# Patient Record
Sex: Male | Born: 1955
Health system: Southern US, Community
[De-identification: ages and names within clinical notes are randomized; demographics above are authoritative.]

## PROBLEM LIST (undated history)

## (undated) DIAGNOSIS — K5730 Diverticulosis of large intestine without perforation or abscess without bleeding: Secondary | ICD-10-CM

## (undated) DIAGNOSIS — L719 Rosacea, unspecified: Secondary | ICD-10-CM

## (undated) HISTORY — DX: Rosacea, unspecified: L71.9

## (undated) HISTORY — DX: Diverticulosis of large intestine without perforation or abscess without bleeding: K57.30

---

## 2009-12-24 HISTORY — PX: APPENDECTOMY: SHX54

## 2009-12-27 ENCOUNTER — Emergency Department (HOSPITAL_COMMUNITY): Admission: EM | Admit: 2009-12-27 | Discharge: 2009-12-27 | Payer: Self-pay | Admitting: Emergency Medicine

## 2009-12-28 ENCOUNTER — Encounter (INDEPENDENT_AMBULATORY_CARE_PROVIDER_SITE_OTHER): Payer: Self-pay

## 2009-12-28 ENCOUNTER — Inpatient Hospital Stay (HOSPITAL_COMMUNITY): Admission: EM | Admit: 2009-12-28 | Discharge: 2009-12-29 | Payer: Self-pay | Admitting: Emergency Medicine

## 2010-02-08 ENCOUNTER — Encounter: Payer: Self-pay | Admitting: Family Medicine

## 2010-02-08 ENCOUNTER — Ambulatory Visit: Payer: Self-pay | Admitting: Family Medicine

## 2010-02-08 DIAGNOSIS — L259 Unspecified contact dermatitis, unspecified cause: Secondary | ICD-10-CM | POA: Insufficient documentation

## 2010-02-08 DIAGNOSIS — D696 Thrombocytopenia, unspecified: Secondary | ICD-10-CM | POA: Insufficient documentation

## 2010-02-08 DIAGNOSIS — D6949 Other primary thrombocytopenia: Secondary | ICD-10-CM | POA: Insufficient documentation

## 2010-02-08 LAB — CONVERTED CEMR LAB
AST: 19 units/L (ref 0–37)
BUN: 17 mg/dL (ref 6–23)
Basophils Absolute: 0.1 10*3/uL (ref 0.0–0.1)
Calcium: 9.3 mg/dL (ref 8.4–10.5)
Chloride: 102 meq/L (ref 96–112)
Creatinine, Ser: 1.1 mg/dL (ref 0.40–1.50)
Eosinophils Relative: 2 % (ref 0–5)
HCT: 44.4 % (ref 39.0–52.0)
Hemoglobin: 14.6 g/dL (ref 13.0–17.0)
LDL Cholesterol: 114 mg/dL — ABNORMAL HIGH (ref 0–99)
Lymphocytes Relative: 34 % (ref 12–46)
Lymphs Abs: 2.4 10*3/uL (ref 0.7–4.0)
Monocytes Absolute: 0.8 10*3/uL (ref 0.1–1.0)
Monocytes Relative: 11 % (ref 3–12)
RDW: 13.7 % (ref 11.5–15.5)
Total CHOL/HDL Ratio: 5.1
VLDL: 43 mg/dL — ABNORMAL HIGH (ref 0–40)

## 2010-02-10 ENCOUNTER — Encounter: Payer: Self-pay | Admitting: Family Medicine

## 2010-05-04 ENCOUNTER — Encounter: Payer: Self-pay | Admitting: Family Medicine

## 2010-05-25 NOTE — Assessment & Plan Note (Signed)
Summary: Jack Wood,df   Vital Signs:  Patient profile:   55 year old male Height:      68.5 inches Weight:      181.38 pounds BMI:     27.28 BSA:     1.97 Pulse rate:   80 / minute BP sitting:   112 / 78  Vitals Entered By: Jone Baseman CMA (February 08, 2010 8:52 AM) CC: Jack Wood Is Patient Diabetic? No Pain Assessment Patient in pain? no        Primary Care Provider:  Alvia Grove DO  CC:  Jack Wood.  History of Present Illness: 55 yo male here for Jack Wood appt to establish medical care.  Concerns today include: 1. Follow up of appendectomy done about 3-4 weeks ago.  Denies abd pain, no fevers, incisions healed well, no draining.  +BM's, + normal UOP.  Did not f/u with surgeon and feels he does not need to due to no percieved complications.  Was not discharged with any abx, is not using any pain meds.  returned to previous healthy basleine. 2. Hx of ITP: would like labs today to evaluate. Does have a hx of blood transfusion in 1991. 3. Preventative medicine: would like to catch up on screening.   4. Skin rash: on face, is red, dry and flaky.  No pattern noted. Present for about 4-6 weeks.  No recent sun exposure. No known trigger. No hx of similar in past.   Habits & Providers  Alcohol-Tobacco-Diet     Alcohol drinks/day: <1     Alcohol Counseling: not indicated; patient does not drink     Tobacco Status: never  Exercise-Depression-Behavior     Does Patient Exercise: no     Have you felt down or hopeless? no     STD Risk: never     Drug Use: no     Seat Belt Use: always     Sun Exposure: rarely  Current Problems (verified): 1)  Laboratory Examination Unspecified  (ICD-V72.60) 2)  Other Primary Thrombocytopenia  (ICD-287.39) 3)  Family History of Cad Male 1st Degree Relative <50  (ICD-V17.3) 4)  Family History of Cad Male 1st Degree Relative <60  (ICD-V16.49) 5)  Skin Rash, Allergic  (ICD-692.9) 6)  Screening For Lipoid Disorders  (ICD-V77.91) 7)  Unspecified Thrombocytopenia   (ICD-287.5)  Current Medications (verified): 1)  None  Allergies (verified): No Known Drug Allergies  Past History:  Past Medical History: ITP, s/p blood transfusion in 1991  Past Surgical History: Appendectomy (2011)  Family History: Family History of CAD Male 1st degree relative <60 Family History of CAD Male 1st degree relative <50 Family History of Stroke F 1st degree relative <60 Family History of Stroke M 1st degree relative <50 Father died at 42 yo Mother died at 49 yo  Social History: Recently moved to [place] from [place].   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 Never Smoked Alcohol use-yes, occasional Drug use-no Regular exercise-no, healthy diet Married to [employer] (also my pt) 2 children, both adopted, Engineer, maintenance (13) and Diplomatic Services operational officer (16) Enjoys camping and fishing Smoking Status:  never Drug Use:  no Does Patient Exercise:  no STD Risk:  never Risk analyst Use:  always Sun Exposure-Excessive:  rarely  Review of Systems  The patient denies anorexia, fever, weight loss, weight gain, vision loss, decreased hearing, hoarseness, chest pain, syncope, dyspnea on exertion, peripheral edema, prolonged cough, headaches, hemoptysis, abdominal pain, melena, hematochezia, severe indigestion/heartburn, hematuria, incontinence, genital sores,  muscle weakness, suspicious skin lesions, transient blindness, difficulty walking, depression, unusual weight change, abnormal bleeding, enlarged lymph nodes, angioedema, breast masses, and testicular masses.    Physical Exam  General:  Vs reviewed, alert, well-developed, well-nourished, and well-hydrated.   Head:  Normocephalic and atraumatic without obvious abnormalities.  Eyes:  No corneal or conjunctival inflammation noted. EOMI. Perrla. Funduscopic exam benign, without hemorrhages, exudates or papilledema. Vision grossly normal. Ears:  R ear normal, L ear normal, and no external deformities.    Nose:  no external deformity and no nasal discharge.   Mouth:  no gingival abnormalities and pharynx pink and moist.   Neck:  supple, full ROM, and no masses.   Lungs:  Normal respiratory effort, chest expands symmetrically. Lungs are clear to auscultation, no crackles or wheezes. Heart:  Normal rate and regular rhythm. S1 and S2 normal without gallop, murmur, click, rub or other extra sounds. Abdomen:  Bowel sounds positive,abdomen soft and non-tender without masses, organomegaly or hernias noted. Msk:  normal ROM, no joint tenderness, and no joint swelling.   Extremities:  No clubbing, cyanosis, edema, or deformity noted with normal full range of motion of all joints.   Neurologic:  alert & oriented X3 and cranial nerves II-XII intact.   Skin:  face is dry and red, no pattern noted.   Cervical Nodes:  No lymphadenopathy noted Psych:  Oriented X3, good eye contact, not anxious appearing, and not depressed appearing.     Impression & Recommendations:  Problem # 1:  Preventive Health Care (ICD-V70.0) Assessment New Pt requests catch up on preventative screening today. Will refer to GI for colonoscopy FLP today Dental referral   Problem # 2:  SKIN RASH, ALLERGIC (ICD-692.9) Worsening in the past few weeks per pt report.  Will refer to derm Orders: Dermatology Referral (Derma)  Problem # 3:  UNSPECIFIED THROMBOCYTOPENIA (ICD-287.5) Hx of.   Will check CBC today to evaluate platlets.   Problem # 4:  LABORATORY EXAMINATION UNSPECIFIED (ICD-V72.60) check cmp today   Other Orders: Lipid-[facility] (0011001100) Lipid-[facility] (40981-19147) CBC w/Diff-[facility] (82956) Comp Met-[facility] (21308-65784) Gastroenterology Referral (GI)  Patient Instructions: 1)  Nice to meet you! 2)  I will call you when I get your labs back 3)  Call Dr. Leanord Asal [phone]) for a dental appointment 4)  [address] [place] 5)  I will set up a GI and dermatology referral for you.  6)  Give your wife my  best! 7)  Please schedule a follow-up appointment as needed .    Orders Added: 1)  Lipid-[facility] [[id number]] 2)  Lipid-[facility] [80061-22930] 3)  CBC w/Diff-[facility] [85025] 4)  Comp Met-[facility] [[id number]] 5)  [facility]- New 40-23yrs [32440] 6)  Gastroenterology Referral [GI] 7)  Dermatology Referral [Derma]     Prevention & Chronic Care Immunizations   Influenza vaccine: Not documented   Influenza vaccine deferral: Deferred  (02/08/2010)    Tetanus booster: Not documented    Pneumococcal vaccine: Not documented  Colorectal Screening   Hemoccult: Not documented    Colonoscopy: Not documented   Colonoscopy action/deferral: GI referral  (02/08/2010)  Other Screening   PSA: Not documented   PSA action/deferral: Discussed-decision deferred  (02/08/2010)   Smoking status: never  (02/08/2010)  Lipids   Total Cholesterol: Not documented   Lipid panel action/deferral: Lipid Panel ordered   LDL: Not documented   LDL Direct: Not documented   HDL: Not documented   Triglycerides: Not documented

## 2010-05-25 NOTE — Letter (Signed)
Summary: Lipid Letter  [facility]  [address]   [place], [place] [address]   Phone: [phone]  Fax: [phone]    02/10/2010  Jack Wood [address] [place], [place]  [address]  Dear Jack Wood:  We have carefully reviewed your last lipid profile from 02/08/2010 and the results are noted below with a summary of recommendations for lipid management.    Cholesterol:       195     Goal: < 200   HDL "good" Cholesterol:   38     Goal: > 40   LDL "bad" Cholesterol:   114     Goal: < 120   Triglycerides:       214     Goal: < 200        TLC Diet (Therapeutic Lifestyle Change): Saturated Fats & Transfatty acids should be kept < 7% of total calories ***Reduce Saturated Fats Polyunstaurated Fat can be up to 10% of total calories Monounsaturated Fat Fat can be up to 20% of total calories Total Fat should be no greater than 25-35% of total calories Carbohydrates should be 50-60% of total calories Protein should be approximately 15% of total calories Fiber should be at least 20-30 grams a day ***Increased fiber may help lower LDL Total Cholesterol should be < 200mg /day Consider adding plant stanol/sterols to diet (example: Benacol spread) ***A higher intake of unsaturated fat may reduce Triglycerides and Increase HDL    Adjunctive Measures (may lower LIPIDS and reduce risk of Heart Attack) include: Aerobic Exercise (20-30 minutes 3-4 times a week) Limit Alcohol Consumption Weight Reduction Aspirin 75-81 mg a day by mouth (if not allergic or contraindicated) Dietary Fiber 20-30 grams a day by mouth     Current Medications:  None If you have any questions, please call. We appreciate being able to work with you.   Sincerely,    [facility] Alvia Grove DO

## 2010-05-27 NOTE — Letter (Signed)
Summary: Generic Letter  FU Referrals  [facility]  [address]   [place], [place] [address]   Phone: [phone]  Fax: [phone]    05/04/2010  Jack Wood [address] [place], [place]  [address]  Dear [name],  Hope Epic is not taking too much time and you still have some for taking care of yourself!  Unfortunately Dr Gomez Cleverly is no longer with [place], we have assigned you to see Dr Ellery Plunk if you need medical care.  In following up on Dr Cyndra Numbers referrals we noticed that you had been referred to Dermatology and Gastroenterology but we have not had any reports from them.   If you have not seen them and would like our assistance in scheduling an appointment please call.  Take Care  Sincerely,  Doneta Public MD  Appended Document: Generic Letter  FU Referrals mailed  Appended Document: Generic Letter  FU Referrals mailed

## 2010-06-07 ENCOUNTER — Other Ambulatory Visit: Payer: Self-pay | Admitting: Emergency Medicine

## 2010-06-09 ENCOUNTER — Ambulatory Visit
Admission: RE | Admit: 2010-06-09 | Discharge: 2010-06-09 | Disposition: A | Discharge status: Home / Self Care | Payer: Commercial Managed Care - PPO | Source: Ambulatory Visit | Attending: Emergency Medicine | Admitting: Emergency Medicine

## 2010-06-09 MED ORDER — IOHEXOL 300 MG/ML  SOLN
100.0000 mL | Freq: Once | INTRAMUSCULAR | Status: AC | PRN
  Administered 2010-06-09: 100 mL via INTRAVENOUS

## 2010-07-08 LAB — URINALYSIS, ROUTINE W REFLEX MICROSCOPIC
Nitrite: NEGATIVE
Protein, ur: NEGATIVE mg/dL
Urobilinogen, UA: 0.2 mg/dL (ref 0.0–1.0)

## 2010-07-08 LAB — DIFFERENTIAL
Basophils Absolute: 0 10*3/uL (ref 0.0–0.1)
Eosinophils Absolute: 0.3 10*3/uL (ref 0.0–0.7)
Lymphocytes Relative: 31 % (ref 12–46)
Lymphs Abs: 2.7 10*3/uL (ref 0.7–4.0)
Neutrophils Relative %: 54 % (ref 43–77)

## 2010-07-08 LAB — CBC
Platelets: 194 10*3/uL (ref 150–400)
RBC: 4.62 MIL/uL (ref 4.22–5.81)
RDW: 12.9 % (ref 11.5–15.5)
WBC: 8.6 10*3/uL (ref 4.0–10.5)

## 2010-07-08 LAB — BASIC METABOLIC PANEL
BUN: 15 mg/dL (ref 6–23)
Calcium: 8.7 mg/dL (ref 8.4–10.5)
Creatinine, Ser: 0.99 mg/dL (ref 0.4–1.5)
GFR calc Af Amer: >60 mL/min (ref >60)

## 2010-08-27 ENCOUNTER — Ambulatory Visit (HOSPITAL_COMMUNITY)
Admission: RE | Admit: 2010-08-27 | Discharge: 2010-08-27 | Disposition: A | Discharge status: Home / Self Care | Payer: 59 | Source: Ambulatory Visit | Attending: Gastroenterology | Admitting: Gastroenterology

## 2010-08-27 DIAGNOSIS — K648 Other hemorrhoids: Secondary | ICD-10-CM | POA: Insufficient documentation

## 2010-08-27 DIAGNOSIS — Z79899 Other long term (current) drug therapy: Secondary | ICD-10-CM | POA: Insufficient documentation

## 2010-08-27 DIAGNOSIS — Z7982 Long term (current) use of aspirin: Secondary | ICD-10-CM | POA: Insufficient documentation

## 2010-08-27 DIAGNOSIS — Z1211 Encounter for screening for malignant neoplasm of colon: Secondary | ICD-10-CM | POA: Insufficient documentation

## 2010-08-27 DIAGNOSIS — K644 Residual hemorrhoidal skin tags: Secondary | ICD-10-CM | POA: Insufficient documentation

## 2010-08-27 DIAGNOSIS — L719 Rosacea, unspecified: Secondary | ICD-10-CM | POA: Insufficient documentation

## 2010-08-27 LAB — HM COLONOSCOPY

## 2012-01-25 ENCOUNTER — Other Ambulatory Visit: Payer: Self-pay | Admitting: Internal Medicine

## 2012-01-25 MED ORDER — SCOPOLAMINE 1 MG/3DAYS TD PT72: 1.0000 | MEDICATED_PATCH | TRANSDERMAL | Status: DC

## 2012-03-26 ENCOUNTER — Encounter: Payer: Self-pay | Admitting: Internal Medicine

## 2013-08-15 ENCOUNTER — Ambulatory Visit (INDEPENDENT_AMBULATORY_CARE_PROVIDER_SITE_OTHER): Payer: 59 | Admitting: Marriage and Family Therapist

## 2013-08-15 ENCOUNTER — Encounter (INDEPENDENT_AMBULATORY_CARE_PROVIDER_SITE_OTHER): Payer: Self-pay

## 2013-08-15 DIAGNOSIS — Z62820 Parent-biological child conflict: Secondary | ICD-10-CM

## 2013-08-15 DIAGNOSIS — Z7189 Other specified counseling: Secondary | ICD-10-CM

## 2013-08-15 DIAGNOSIS — F411 Generalized anxiety disorder: Secondary | ICD-10-CM

## 2013-08-15 DIAGNOSIS — Z630 Problems in relationship with spouse or partner: Secondary | ICD-10-CM

## 2013-08-15 NOTE — Progress Notes (Signed)
MARRIAGE COUNSELING ASSESSMENT  Session Time:  8:00 - 9:00 a.m.  Participation Level: Active  Behavioral Response: CasualAlertAnxious  Type of Therapy: Marriage Counseling  Treatment Goals addressed: Anxiety, Communication: Listening skills, Coping and Diagnosis: Determining diagnosis  Interventions: Other: assessing and identification of problems  Summary: Jack Wood is a 58 y.o. male who presents with anxiety.  He was present with his wife, Jack Wood for marriage counseling.  The couples are Caucasian.  They were referred for marriage counseling by Jack Wood who sees their daughter, Jack Wood.  Patient and wife say they are here to learn coping skills relating to working together as a team relating to problems with their children.  Both reports a lot of stress and anxiety individually and as a couple due to the "chaos." They reports having two adopted children who both have severe mental health problems.  Right now they report the focus is on their 56 year-old daughter.  They reports since she was 8118 months old they knew there was a problem mentally with their child.  They report having little information about the parents but did know she is from [place], her mother was young, and patient reported the possibility that her mother was drinking.  He reports she has ADHD, depression, oppositional defiant disorder, and intermittent explosive disorder.  About three weeks ago her behavior escalated when the daughter pushed someone in school and was suspended.  They are trying to get her in homebound school and arrange in-home intensive counseling.  Both report there are problems between them as a result.  They reports the daughter tends to "manipulate and triangulate" the parents.  Wife reports she has to be the disciplinarian with the daughter and husband tends to "give in" to the daughter, causing problems between them.  Husband reports they also have poor communication causing tension between them.   Wife goes to [employer] and both have support through the parents at [facility], a substance abuse program their son attended.  They reports the sonAaron, age 56, continues to use substances and does not live in their home.  How they do work together as a couple is patient is good at paying the bills and wife is good at researching resources available to help their children. They have known each other 31 years and have been married 29 years.  Both state that no matter what happens with the children it will not cause them to be divorced, they "just need some help."  Wife is a Runner, broadcasting/film/videoteacher in [place] and husband works for [employer].  Both are busy but try to have dinner together but this does not often work.  Patient's goal for therapy, "to learn how to pick the appropriate battles with each other."  Wife's goal of therapy, "to work together as a team."  Suicidal/Homicidal: NA  Therapist Response:  Assessed individual for anxiety and depression.  Through self-discussion and body language patient does not appear depressed but more stressed and anxious.  Will talk individually with wife to rule out depression.  Both are motivated for therapy.  Treatment goal is to work more as a team and prioritize their marriage.  Talked about ways they can begin to do this including having more time together, including time when talking about the children is forbidden.  Gave the couple tips on reconnecting e.g, if they start to have an argument have eye contact and use a code word that they are getting out of hand or give each other a touch on the arm.  Treatment plan will include how to become closer as a couple, how to work on as a team, to identify systemically what is causing the distancing from each other, couple communication skills.  Discussed what to expect from counseling, gave both document on "What to Expect from Couples Counseling,"  Will see wife and husband individually in the next sessions. Discussed telephone procedure, days  therapist is here.  Discussed rules around not triagulating with therapist.  Note:  Discussed this writer being on vacation from 08/23/13 and returning on 09/10/13.    Plan: Return again in 1 month.  Diagnosis: Axis I: Generalized Anxiety Disorder; Distress with spouse; parent/child relational problem.      Axis II: Deferred    Jack Paro, LMFT, CTS 08/15/2013

## 2013-09-17 ENCOUNTER — Ambulatory Visit (HOSPITAL_COMMUNITY): Payer: Commercial Managed Care - PPO | Admitting: Marriage and Family Therapist

## 2013-09-17 NOTE — Progress Notes (Unsigned)
   THERAPIST PROGRESS NOTE  Session Time:  3:00 - 4:00 p.m.  Participation Level: {BHH PARTICIPATION LEVEL:22264}  Behavioral Response: {Appearance:22683}{BHH LEVEL OF CONSCIOUSNESS:22305}{BHH MOOD:22306}  Type of Therapy: Individual Therapy  Treatment Goals addressed: Coping  Interventions: {CHL AMB BH Type of Intervention:21022753}  Summary: Jack Wood is a 58 y.o. male who presents with anxiety and marriage problems.  He was referred by Dr. Lucianne Muss.     Suicidal/Homicidal: {BHH YES OR NO:22294}{yes/no/with/without intent/plan:22693}  Therapist Response: ***  Plan: Return again in *** weeks.  Diagnosis: Axis I: GAD; Partner relational problem    Axis II: Deferred    Annabeth Tortora, LMFT, CTS 09/17/2013

## 2013-09-18 ENCOUNTER — Telehealth (HOSPITAL_COMMUNITY): Payer: Self-pay | Admitting: Marriage and Family Therapist

## 2013-09-18 ENCOUNTER — Ambulatory Visit (HOSPITAL_COMMUNITY): Payer: Commercial Managed Care - PPO | Admitting: Marriage and Family Therapist

## 2013-09-23 ENCOUNTER — Ambulatory Visit: Payer: 59 | Admitting: Licensed Clinical Social Worker

## 2013-09-27 ENCOUNTER — Ambulatory Visit (INDEPENDENT_AMBULATORY_CARE_PROVIDER_SITE_OTHER): Payer: 59 | Admitting: Licensed Clinical Social Worker

## 2013-09-27 DIAGNOSIS — F4320 Adjustment disorder, unspecified: Secondary | ICD-10-CM

## 2013-10-04 ENCOUNTER — Ambulatory Visit (INDEPENDENT_AMBULATORY_CARE_PROVIDER_SITE_OTHER): Payer: 59 | Admitting: Licensed Clinical Social Worker

## 2013-10-04 DIAGNOSIS — F4320 Adjustment disorder, unspecified: Secondary | ICD-10-CM

## 2013-10-08 ENCOUNTER — Ambulatory Visit (HOSPITAL_COMMUNITY): Payer: Commercial Managed Care - PPO | Admitting: Marriage and Family Therapist

## 2015-01-02 ENCOUNTER — Telehealth: Payer: Self-pay

## 2015-01-02 NOTE — Telephone Encounter (Signed)
Spoke to VL about pt and his son.   PCP requested to get pt and his son scheduled.  Spoke to pt and cpe is sched for December.  Pt son is scheduled for hospital follow up for Tuesday  01/06/2015 at 9:15 am. This may need to be moved to Thursday 01/08/2015 at 9:15am depending on when he is discharged.

## 2015-03-09 ENCOUNTER — Other Ambulatory Visit: Payer: Self-pay | Admitting: Internal Medicine

## 2015-03-09 MED ORDER — AMOXICILLIN-POT CLAVULANATE 875-125 MG PO TABS: 1.0000 | ORAL_TABLET | Freq: Two times a day (BID) | ORAL | Status: AC

## 2015-03-13 ENCOUNTER — Other Ambulatory Visit: Payer: Self-pay | Admitting: Internal Medicine

## 2015-03-13 MED ORDER — AZITHROMYCIN 250 MG PO TABS: ORAL_TABLET | ORAL | Status: AC

## 2015-04-09 ENCOUNTER — Encounter: Payer: Self-pay | Admitting: Internal Medicine

## 2015-04-09 ENCOUNTER — Ambulatory Visit (INDEPENDENT_AMBULATORY_CARE_PROVIDER_SITE_OTHER): Payer: 59 | Admitting: Internal Medicine

## 2015-04-09 ENCOUNTER — Other Ambulatory Visit (INDEPENDENT_AMBULATORY_CARE_PROVIDER_SITE_OTHER): Payer: 59

## 2015-04-09 VITALS — BP 124/84 | HR 82 | Temp 97.6°F | Ht 68.5 in | Wt 192.5 lb

## 2015-04-09 DIAGNOSIS — Z136 Encounter for screening for cardiovascular disorders: Secondary | ICD-10-CM | POA: Diagnosis not present

## 2015-04-09 DIAGNOSIS — L719 Rosacea, unspecified: Secondary | ICD-10-CM | POA: Insufficient documentation

## 2015-04-09 DIAGNOSIS — R7989 Other specified abnormal findings of blood chemistry: Secondary | ICD-10-CM | POA: Diagnosis not present

## 2015-04-09 DIAGNOSIS — Z0000 Encounter for general adult medical examination without abnormal findings: Secondary | ICD-10-CM

## 2015-04-09 DIAGNOSIS — Z114 Encounter for screening for human immunodeficiency virus [HIV]: Secondary | ICD-10-CM

## 2015-04-09 DIAGNOSIS — Z1159 Encounter for screening for other viral diseases: Secondary | ICD-10-CM | POA: Diagnosis not present

## 2015-04-09 LAB — CBC WITH DIFFERENTIAL/PLATELET
BASOS ABS: 0 10*3/uL (ref 0.0–0.1)
Basophils Relative: 0.5 % (ref 0.0–3.0)
EOS ABS: 0.3 10*3/uL (ref 0.0–0.7)
Eosinophils Relative: 3.2 % (ref 0.0–5.0)
HEMATOCRIT: 46.1 % (ref 39.0–52.0)
Hemoglobin: 15.4 g/dL (ref 13.0–17.0)
LYMPHS PCT: 31.2 % (ref 12.0–46.0)
Lymphs Abs: 3.2 10*3/uL (ref 0.7–4.0)
MCHC: 33.4 g/dL (ref 30.0–36.0)
MCV: 87.8 fl (ref 78.0–100.0)
MONOS PCT: 9.1 % (ref 3.0–12.0)
Monocytes Absolute: 0.9 10*3/uL (ref 0.1–1.0)
NEUTROS ABS: 5.7 10*3/uL (ref 1.4–7.7)
Neutrophils Relative %: 56 % (ref 43.0–77.0)
PLATELETS: 281 10*3/uL (ref 150.0–400.0)
RBC: 5.25 Mil/uL (ref 4.22–5.81)
RDW: 13.6 % (ref 11.5–15.5)
WBC: 10.1 10*3/uL (ref 4.0–10.5)

## 2015-04-09 LAB — LIPID PANEL
Cholesterol: 219 mg/dL — ABNORMAL HIGH (ref 0–200)
HDL: 38.7 mg/dL — AB (ref >39.00)
NONHDL: 180.55
Total CHOL/HDL Ratio: 6
Triglycerides: 242 mg/dL — ABNORMAL HIGH (ref 0.0–149.0)
VLDL: 48.4 mg/dL — AB (ref 0.0–40.0)

## 2015-04-09 LAB — URINALYSIS, ROUTINE W REFLEX MICROSCOPIC
Bilirubin Urine: NEGATIVE
KETONES UR: NEGATIVE
LEUKOCYTES UA: NEGATIVE
Nitrite: NEGATIVE
RBC / HPF: NONE SEEN (ref 0–?)
SPECIFIC GRAVITY, URINE: 1.02 (ref 1.000–1.030)
TOTAL PROTEIN, URINE-UPE24: NEGATIVE
URINE GLUCOSE: NEGATIVE
Urobilinogen, UA: 0.2 (ref 0.0–1.0)
WBC, UA: NONE SEEN (ref 0–?)
pH: 5.5 (ref 5.0–8.0)

## 2015-04-09 LAB — BASIC METABOLIC PANEL
BUN: 18 mg/dL (ref 6–23)
CALCIUM: 9.6 mg/dL (ref 8.4–10.5)
CO2: 33 mEq/L — ABNORMAL HIGH (ref 19–32)
CREATININE: 1.04 mg/dL (ref 0.40–1.50)
Chloride: 104 mEq/L (ref 96–112)
GFR: 78.19 mL/min (ref >60.00)
Glucose, Bld: 105 mg/dL — ABNORMAL HIGH (ref 70–99)
Potassium: 4.5 mEq/L (ref 3.5–5.1)
SODIUM: 144 meq/L (ref 135–145)

## 2015-04-09 LAB — HEPATIC FUNCTION PANEL
ALBUMIN: 4.7 g/dL (ref 3.5–5.2)
ALK PHOS: 62 U/L (ref 39–117)
ALT: 25 U/L (ref 0–53)
AST: 15 U/L (ref 0–37)
BILIRUBIN DIRECT: 0.1 mg/dL (ref 0.0–0.3)
TOTAL PROTEIN: 8 g/dL (ref 6.0–8.3)
Total Bilirubin: 0.4 mg/dL (ref 0.2–1.2)

## 2015-04-09 LAB — TSH: TSH: 2.92 u[IU]/mL (ref 0.35–4.50)

## 2015-04-09 LAB — LDL CHOLESTEROL, DIRECT: Direct LDL: 156 mg/dL

## 2015-04-09 LAB — PSA: PSA: 0.89 ng/mL (ref 0.10–4.00)

## 2015-04-09 NOTE — Progress Notes (Signed)
Subjective:    Patient ID: Jack Wood, male    DOB: [birth year], 57 y.o.   MRN: [id number]  HPI  New to our practice - here to establish with PCP patient is here today for annual physical. Patient feels well and has no complaints. Also reviewed chronic medical conditions, interval events and current concerns  Past Medical History  Diagnosis Date  . Acne rosacea   . Diverticula, colon     diverticulitis in 08/2010, 11/2014   Family History  Problem Relation Age of Onset  . Coronary artery disease Father   . Valvular heart disease Father   . Hypertension Father   . Osteoporosis Mother   . Valvular heart disease Mother   . Cirrhosis Sister 37    EtOH related   Social History  Substance Use Topics  . Smoking status: Never Smoker   . Smokeless tobacco: Not on file  . Alcohol Use: 0.0 oz/week    0 Standard drinks or equivalent per week     Comment: social    Review of Systems  Constitutional: Negative for fever, activity change, appetite change, fatigue and unexpected weight change.  Respiratory: Negative for cough, chest tightness, shortness of breath and wheezing.   Cardiovascular: Negative for chest pain, palpitations and leg swelling.  Musculoskeletal: Positive for back pain (working w/ PT for same - improved in past 4 weeks).  Neurological: Negative for dizziness, weakness and headaches.  Psychiatric/Behavioral: Negative for dysphoric mood. The patient is not nervous/anxious.   All other systems reviewed and are negative.      Objective:    Physical Exam  Constitutional: He is oriented to person, place, and time. He appears well-developed and well-nourished. No distress.  HENT:  Head: Normocephalic and atraumatic.  Nose: Nose normal.  Mouth/Throat: Oropharynx is clear and moist.  Hearing grossly normal.  Eyes: Conjunctivae and EOM are normal. Pupils are equal, round, and reactive to light. No scleral icterus.  Neck: Normal range of motion. Neck supple. No  JVD present. No thyromegaly present.  Cardiovascular: Normal rate, regular rhythm, normal heart sounds and intact distal pulses.  Exam reveals no friction rub.   No murmur heard. No edema.  Pulmonary/Chest: Effort normal and breath sounds normal. No respiratory distress. He has no wheezes.  Abdominal: Soft. Bowel sounds are normal. He exhibits no distension and no mass. There is no tenderness. There is no guarding.  Genitourinary:  defer  Musculoskeletal: Normal range of motion. He exhibits no edema or tenderness.  Lymphadenopathy:    He has no cervical adenopathy.  Neurological: He is alert and oriented to person, place, and time. He has normal reflexes. No cranial nerve deficit.  Skin: Skin is warm and dry. No rash noted. No erythema.  Psychiatric: He has a normal mood and affect. His behavior is normal. Thought content normal.    BP 124/84 mmHg  Pulse 82  Temp(Src) 97.6 F (36.4 C) (Oral)  Ht 5' 8.5" (1.74 m)  Wt 192 lb 8 oz (87.317 kg)  BMI 28.84 kg/m2  SpO2 94% Wt Readings from Last 3 Encounters:  04/09/15 192 lb 8 oz (87.317 kg)  02/08/10 181 lb 6.1 oz (82.274 kg)    Lab Results  Component Value Date   WBC 7.1 02/08/2010   HGB 14.6 02/08/2010   HCT 44.4 02/08/2010   PLT 241 02/08/2010   GLUCOSE 106* 02/08/2010   CHOL 195 02/08/2010   TRIG 214* 02/08/2010   HDL 38* 02/08/2010   LDLCALC 114* 02/08/2010  ALT 28 02/08/2010   AST 19 02/08/2010   NA 140 02/08/2010   K 5.1 02/08/2010   CL 102 02/08/2010   CREATININE 1.10 02/08/2010   BUN 17 02/08/2010   CO2 28 02/08/2010    No results found.  ECG: NSR @ 68 bpm - no arrhythmia or ischemic changes     Assessment & Plan:   CPX/z00.00 - Patient has been counseled on age-appropriate routine health concerns for screening and prevention. These are reviewed and up-to-date. Immunizations are up-to-date or declined. Labs and ECG ordered/reviewed.  Problem List Items Addressed This Visit    None    Visit Diagnoses      Routine general medical examination at a health care facility    -  Primary    Relevant Orders    Basic metabolic panel    CBC with Differential/Platelet    Hepatic function panel    Lipid panel    TSH    Urinalysis, Routine w reflex microscopic ([facility])    PSA    Hepatitis C antibody    HIV antibody    Screening for cardiovascular condition        Relevant Orders    EKG 12-Lead (Completed)    Need for hepatitis C screening test        Relevant Orders    Hepatitis C antibody    Screening for HIV without presence of risk factors        Relevant Orders    HIV antibody        Rene PaciValerie Leala Bryand, MD

## 2015-04-09 NOTE — Progress Notes (Signed)
Pre visit review using our clinic review tool, if applicable. No additional management support is needed unless otherwise documented below in the visit note. 

## 2015-04-09 NOTE — Patient Instructions (Addendum)
It was good to see you today.  We have reviewed your prior records including labs and tests today  Health Maintenance reviewed - all recommended immunizations and age-appropriate screenings are up-to-date.  Test(s) ordered today. Your results will be released to MyChart (or called to you) after review, usually within 72hours after test completion. If any changes need to be made, you will be notified at that same time.  Medications reviewed and updated, no changes recommended at this time.  Please schedule followup in 12 months for annual exam and labs with Dr. Lawerance Wood, call sooner if problems.  Health Maintenance, Male A healthy lifestyle and preventative care can promote health and wellness.  Maintain regular health, dental, and eye exams.  Eat a healthy diet. Foods like vegetables, fruits, whole grains, low-fat dairy products, and lean protein foods contain the nutrients you need and are low in calories. Decrease your intake of foods high in solid fats, added sugars, and salt. Get information about a proper diet from your health care provider, if necessary.  Regular physical exercise is one of the most important things you can do for your health. Most adults should get at least 150 minutes of moderate-intensity exercise (any activity that increases your heart rate and causes you to sweat) each week. In addition, most adults need muscle-strengthening exercises on 2 or more days a week.   Maintain a healthy weight. The body mass index (BMI) is a screening tool to identify possible weight problems. It provides an estimate of body fat based on height and weight. Your health care provider can find your BMI and can help you achieve or maintain a healthy weight. For males 20 years and older:  A BMI below 18.5 is considered underweight.  A BMI of 18.5 to 24.9 is normal.  A BMI of 25 to 29.9 is considered overweight.  A BMI of 30 and above is considered obese.  Maintain normal blood lipids and  cholesterol by exercising and minimizing your intake of saturated fat. Eat a balanced diet with plenty of fruits and vegetables. Blood tests for lipids and cholesterol should begin at age 80 and be repeated every 5 years. If your lipid or cholesterol levels are high, you are over age 10, or you are at high risk for heart disease, you may need your cholesterol levels checked more frequently.Ongoing high lipid and cholesterol levels should be treated with medicines if diet and exercise are not working.  If you smoke, find out from your health care provider how to quit. If you do not use tobacco, do not start.  Lung cancer screening is recommended for adults aged 55-80 years who are at high risk for developing lung cancer because of a history of smoking. A yearly low-dose CT scan of the lungs is recommended for people who have at least a 30-pack-year history of smoking and are current smokers or have quit within the past 15 years. A pack year of smoking is smoking an average of 1 pack of cigarettes a day for 1 year (for example, a 30-pack-year history of smoking could mean smoking 1 pack a day for 30 years or 2 packs a day for 15 years). Yearly screening should continue until the smoker has stopped smoking for at least 15 years. Yearly screening should be stopped for people who develop a health problem that would prevent them from having lung cancer treatment.  If you choose to drink alcohol, do not have more than 2 drinks per day. One drink is considered  to be 12 oz (360 mL) of beer, 5 oz (150 mL) of wine, or 1.5 oz (45 mL) of liquor.  Avoid the use of street drugs. Do not share needles with anyone. Ask for help if you need support or instructions about stopping the use of drugs.  High blood pressure causes heart disease and increases the risk of stroke. High blood pressure is more likely to develop in:  People who have blood pressure in the end of the normal range (100-139/85-89 mm Hg).  People who are  overweight or obese.  People who are African American.  If you are 58-18 years of age, have your blood pressure checked every 3-5 years. If you are 29 years of age or older, have your blood pressure checked every year. You should have your blood pressure measured twice--once when you are at a hospital or clinic, and once when you are not at a hospital or clinic. Record the average of the two measurements. To check your blood pressure when you are not at a hospital or clinic, you can use:  An automated blood pressure machine at a pharmacy.  A home blood pressure monitor.  If you are 62-89 years old, ask your health care provider if you should take aspirin to prevent heart disease.  Diabetes screening involves taking a blood sample to check your fasting blood sugar level. This should be done once every 3 years after age 48 if you are at a normal weight and without risk factors for diabetes. Testing should be considered at a younger age or be carried out more frequently if you are overweight and have at least 1 risk factor for diabetes.  Colorectal cancer can be detected and often prevented. Most routine colorectal cancer screening begins at the age of 24 and continues through age 56. However, your health care provider may recommend screening at an earlier age if you have risk factors for colon cancer. On a yearly basis, your health care provider may provide home test kits to check for hidden blood in the stool. A small camera at the end of a tube may be used to directly examine the colon (sigmoidoscopy or colonoscopy) to detect the earliest forms of colorectal cancer. Talk to your health care provider about this at age 96 when routine screening begins. A direct exam of the colon should be repeated every 5-10 years through age 54, unless early forms of precancerous polyps or small growths are found.  People who are at an increased risk for hepatitis B should be screened for this virus. You are  considered at high risk for hepatitis B if:  You were born in a country where hepatitis B occurs often. Talk with your health care provider about which countries are considered high risk.  Your parents were born in a high-risk country and you have not received a shot to protect against hepatitis B (hepatitis B vaccine).  You have HIV or AIDS.  You use needles to inject street drugs.  You live with, or have sex with, someone who has hepatitis B.  You are a man who has sex with other men (MSM).  You get hemodialysis treatment.  You take certain medicines for conditions like cancer, organ transplantation, and autoimmune conditions.  Hepatitis C blood testing is recommended for all people born from [birth year] through 1965 and any individual with known risk factors for hepatitis C.  Healthy men should no longer receive prostate-specific antigen (PSA) blood tests as part of routine cancer screening. Talk  to your health care provider about prostate cancer screening.  Testicular cancer screening is not recommended for adolescents or adult males who have no symptoms. Screening includes self-exam, a health care provider exam, and other screening tests. Consult with your health care provider about any symptoms you have or any concerns you have about testicular cancer.  Practice safe sex. Use condoms and avoid high-risk sexual practices to reduce the spread of sexually transmitted infections (STIs).  You should be screened for STIs, including gonorrhea and chlamydia if:  You are sexually active and are younger than 24 years.  You are older than 24 years, and your health care provider tells you that you are at risk for this type of infection.  Your sexual activity has changed since you were last screened, and you are at an increased risk for chlamydia or gonorrhea. Ask your health care provider if you are at risk.  If you are at risk of being infected with HIV, it is recommended that you take a  prescription medicine daily to prevent HIV infection. This is called pre-exposure prophylaxis (PrEP). You are considered at risk if:  You are a man who has sex with other men (MSM).  You are a heterosexual man who is sexually active with multiple partners.  You take drugs by injection.  You are sexually active with a partner who has HIV.  Talk with your health care provider about whether you are at high risk of being infected with HIV. If you choose to begin PrEP, you should first be tested for HIV. You should then be tested every 3 months for as long as you are taking PrEP.  Use sunscreen. Apply sunscreen liberally and repeatedly throughout the day. You should seek shade when your shadow is shorter than you. Protect yourself by wearing long sleeves, pants, a wide-brimmed hat, and sunglasses year round whenever you are outdoors.  Tell your health care provider of new moles or changes in moles, especially if there is a change in shape or color. Also, tell your health care provider if a mole is larger than the size of a pencil eraser.  A one-time screening for abdominal aortic aneurysm (AAA) and surgical repair of large AAAs by ultrasound is recommended for men aged 65-75 years who are current or former smokers.  Stay current with your vaccines (immunizations).   This information is not intended to replace advice given to you by your health care provider. Make sure you discuss any questions you have with your health care provider.   Document Released: 10/08/2007 Document Revised: 05/02/2014 Document Reviewed: 09/06/2010 Elsevier Interactive Patient Education [employer]2016 Elsevier Inc.

## 2015-04-10 LAB — HEPATITIS C ANTIBODY: HCV Ab: NEGATIVE

## 2015-04-10 LAB — HIV ANTIBODY (ROUTINE TESTING W REFLEX): HIV 1&2 Ab, 4th Generation: NONREACTIVE

## 2015-11-07 ENCOUNTER — Telehealth: Payer: Self-pay | Admitting: Internal Medicine

## 2015-11-07 MED ORDER — SCOPOLAMINE 1 MG/3DAYS TD PT72: 1.0000 | MEDICATED_PATCH | TRANSDERMAL | Status: DC

## 2015-11-07 NOTE — Telephone Encounter (Signed)
Pt has used antiemesis patch well in past and requests rx for same - erx done - to call if problems or concerns Edit - [facility] - left message for pharmacist - to call my cell if questions Pt aware

## 2015-11-09 MED FILL — TRANSDERM-SCOP 1.5 MG/72HR: 1 | 24 days supply | Qty: 8 | Fill #0

## 2016-04-01 ENCOUNTER — Ambulatory Visit (INDEPENDENT_AMBULATORY_CARE_PROVIDER_SITE_OTHER): Payer: 59 | Admitting: Internal Medicine

## 2016-04-01 ENCOUNTER — Encounter: Payer: Self-pay | Admitting: Internal Medicine

## 2016-04-01 ENCOUNTER — Other Ambulatory Visit: Payer: Self-pay | Admitting: Internal Medicine

## 2016-04-01 ENCOUNTER — Other Ambulatory Visit (INDEPENDENT_AMBULATORY_CARE_PROVIDER_SITE_OTHER): Payer: 59

## 2016-04-01 VITALS — BP 126/92 | HR 67 | Temp 97.6°F | Resp 16 | Wt 191.0 lb

## 2016-04-01 DIAGNOSIS — L719 Rosacea, unspecified: Secondary | ICD-10-CM | POA: Diagnosis not present

## 2016-04-01 DIAGNOSIS — R739 Hyperglycemia, unspecified: Secondary | ICD-10-CM | POA: Diagnosis not present

## 2016-04-01 DIAGNOSIS — Z0000 Encounter for general adult medical examination without abnormal findings: Secondary | ICD-10-CM

## 2016-04-01 LAB — COMPREHENSIVE METABOLIC PANEL
ALK PHOS: 54 U/L (ref 39–117)
ALT: 24 U/L (ref 0–53)
AST: 17 U/L (ref 0–37)
Albumin: 4.9 g/dL (ref 3.5–5.2)
BILIRUBIN TOTAL: 0.7 mg/dL (ref 0.2–1.2)
BUN: 22 mg/dL (ref 6–23)
CALCIUM: 9.4 mg/dL (ref 8.4–10.5)
CO2: 30 mEq/L (ref 19–32)
Chloride: 103 mEq/L (ref 96–112)
Creatinine, Ser: 1.23 mg/dL (ref 0.40–1.50)
GFR: 64.2 mL/min (ref >60.00)
GLUCOSE: 116 mg/dL — AB (ref 70–99)
POTASSIUM: 4.4 meq/L (ref 3.5–5.1)
Sodium: 141 mEq/L (ref 135–145)
TOTAL PROTEIN: 8.1 g/dL (ref 6.0–8.3)

## 2016-04-01 LAB — CBC WITH DIFFERENTIAL/PLATELET
BASOS ABS: 0.1 10*3/uL (ref 0.0–0.1)
Basophils Relative: 0.7 % (ref 0.0–3.0)
EOS PCT: 4.8 % (ref 0.0–5.0)
Eosinophils Absolute: 0.4 10*3/uL (ref 0.0–0.7)
HEMATOCRIT: 44.5 % (ref 39.0–52.0)
Hemoglobin: 15.4 g/dL (ref 13.0–17.0)
LYMPHS ABS: 2.9 10*3/uL (ref 0.7–4.0)
LYMPHS PCT: 33.7 % (ref 12.0–46.0)
MCHC: 34.7 g/dL (ref 30.0–36.0)
MCV: 86 fl (ref 78.0–100.0)
MONOS PCT: 10.3 % (ref 3.0–12.0)
Monocytes Absolute: 0.9 10*3/uL (ref 0.1–1.0)
NEUTROS ABS: 4.3 10*3/uL (ref 1.4–7.7)
NEUTROS PCT: 50.5 % (ref 43.0–77.0)
PLATELETS: 304 10*3/uL (ref 150.0–400.0)
RBC: 5.18 Mil/uL (ref 4.22–5.81)
RDW: 13.4 % (ref 11.5–15.5)
WBC: 8.6 10*3/uL (ref 4.0–10.5)

## 2016-04-01 LAB — LIPID PANEL
Cholesterol: 226 mg/dL — ABNORMAL HIGH (ref 0–200)
HDL: 40 mg/dL (ref >39.00)
LDL Cholesterol: 162 mg/dL — ABNORMAL HIGH (ref 0–99)
NonHDL: 185.91
TRIGLYCERIDES: 122 mg/dL (ref 0.0–149.0)
Total CHOL/HDL Ratio: 6
VLDL: 24.4 mg/dL (ref 0.0–40.0)

## 2016-04-01 LAB — HEMOGLOBIN A1C: Hgb A1c MFr Bld: 6.5 % (ref 4.6–6.5)

## 2016-04-01 LAB — TSH: TSH: 1.21 u[IU]/mL (ref 0.35–4.50)

## 2016-04-01 NOTE — Assessment & Plan Note (Addendum)
Using selsun blue which is working Takes doxycycline as needed for rosacea - has not taken in a while Continue same

## 2016-04-01 NOTE — Progress Notes (Signed)
Subjective:    Patient ID: Jack Wood, male    DOB: 01/18/1956, 60 y.o.   MRN: [id number]  HPI He is here for a physical exam.   He denies changes in his health or family health.  He has no concerns.   He plans on retiring the beginning of next year.  He is starting to do weight watchers with his wife.  He is not exercising regularly, but plans on starting.     Medications and allergies reviewed with patient and updated if appropriate.  Patient Active Problem List   Diagnosis Date Noted  . Acne rosacea     Current Outpatient Prescriptions on File Prior to Visit  Medication Sig Dispense Refill  . Doxycycline Hyclate (ACTICLATE) 150 MG TABS Take 100 mg by mouth as needed. Use as needed for Rosacea flare up.     No current facility-administered medications on file prior to visit.     Past Medical History:  Diagnosis Date  . Acne rosacea   . Diverticula, colon    diverticulitis in 08/2010, 11/2014    Past Surgical History:  Procedure Laterality Date  . APPENDECTOMY  12/2009    Social History   Social History  . Marital status: Married    Spouse name: N/A  . Number of children: N/A  . Years of education: N/A   Social History Main Topics  . Smoking status: Never Smoker  . Smokeless tobacco: Never Used  . Alcohol use 0.0 oz/week     Comment: social  . Drug use: No  . Sexual activity: Not Asked   Other Topics Concern  . 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 Osteoporosis Mother   . Valvular heart disease Mother   . Coronary artery disease Father   . Valvular heart disease Father   . Hypertension Father   . Cirrhosis Sister [id number]    EtOH related    Review of Systems  Constitutional: Negative for appetite change, chills, fatigue, fever and unexpected weight change.  HENT: Positive for hearing loss. Negative for tinnitus.   Eyes: Negative for  visual disturbance.  Respiratory: Negative for apnea, cough, shortness of breath and wheezing.        Snores, told years ago he had sleep apnea  Cardiovascular: Negative for chest pain, palpitations and leg swelling.  Gastrointestinal: Negative for abdominal pain, blood in stool, constipation, diarrhea and nausea.       No gerd  Genitourinary: Negative for difficulty urinating, dysuria and hematuria.  Musculoskeletal: Positive for back pain. Negative for arthralgias and myalgias.  Skin: Negative for color change and rash.  Neurological: Negative for dizziness, light-headedness and headaches.  Psychiatric/Behavioral: Negative for dysphoric mood. The patient is not nervous/anxious.        Objective:   Vitals:   04/01/16 0754  BP: (!) 126/92  Pulse: 67  Resp: 16  Temp: 97.6 F (36.4 C)   Filed Weights   04/01/16 0754  Weight: 191 lb (86.6 kg)   Body mass index is 28.62 kg/m.   Physical Exam    Constitutional: He appears well-developed and well-nourished. No distress.  HENT:  Head: Normocephalic and atraumatic.  Right Ear: External ear normal.  Left Ear: External ear normal.  Mouth/Throat: Oropharynx is clear and moist.  Normal ear canals and TM b/l  Eyes: Conjunctivae and EOM are normal.  Neck: Neck supple. No tracheal deviation present. No thyromegaly present.  No carotid bruit  Cardiovascular: Normal rate, regular rhythm, normal heart sounds and intact distal pulses.   No murmur heard. Pulmonary/Chest: Effort normal and breath sounds normal. No respiratory distress. He has no wheezes. He has no rales.  Abdominal: Soft. Bowel sounds are normal. He exhibits no distension. There is no tenderness.  Genitourinary:  Normal sized prostate without nodules Musculoskeletal: He exhibits no edema.  Lymphadenopathy:   He has no cervical adenopathy.  Skin: Skin is warm and dry. He is not diaphoretic.  Psychiatric: He has a normal mood and affect. His behavior is normal.        Assessment & Plan:    Physical exam: Screening blood work ordered Immunizations   Up to date  Colonoscopy   Up to date  Eye exams  Up to date  Exercise - not exercising, but plans on starting - stressed regular exercise Weight -  Joined weight watchers Skin  - no concerns, discussed seeing derm in future for skin check given h/o sunburns in past Substance abuse  none  See Problem List for Assessment and Plan of chronic medical problems.

## 2016-04-01 NOTE — Patient Instructions (Addendum)
Test(s) ordered today. Your results will be released to MyChart (or called to you) after review, usually within 72hours after test completion. If any changes need to be made, you will be notified at that same time.  All other Health Maintenance issues reviewed.   All recommended immunizations and age-appropriate screenings are up-to-date or discussed.  No immunizations administered today.   Medications reviewed and updated.  No changes recommended at this time.   Please followup in one year for a physical    Health Maintenance, Male A healthy lifestyle and preventative care can promote health and wellness.  Maintain regular health, dental, and eye exams.  Eat a healthy diet. Foods like vegetables, fruits, whole grains, low-fat dairy products, and lean protein foods contain the nutrients you need and are low in calories. Decrease your intake of foods high in solid fats, added sugars, and salt. Get information about a proper diet from your health care provider, if necessary.  Regular physical exercise is one of the most important things you can do for your health. Most adults should get at least 150 minutes of moderate-intensity exercise (any activity that increases your heart rate and causes you to sweat) each week. In addition, most adults need muscle-strengthening exercises on 2 or more days a week.   Maintain a healthy weight. The body mass index (BMI) is a screening tool to identify possible weight problems. It provides an estimate of body fat based on height and weight. Your health care provider can find your BMI and can help you achieve or maintain a healthy weight. For males 20 years and older:  A BMI below 18.5 is considered underweight.  A BMI of 18.5 to 24.9 is normal.  A BMI of 25 to 29.9 is considered overweight.  A BMI of 30 and above is considered obese.  Maintain normal blood lipids and cholesterol by exercising and minimizing your intake of saturated fat. Eat a  balanced diet with plenty of fruits and vegetables. Blood tests for lipids and cholesterol should begin at age 20 and be repeated every 5 years. If your lipid or cholesterol levels are high, you are over age 50, or you are at high risk for heart disease, you may need your cholesterol levels checked more frequently.Ongoing high lipid and cholesterol levels should be treated with medicines if diet and exercise are not working.  If you smoke, find out from your health care provider how to quit. If you do not use tobacco, do not start.  Lung cancer screening is recommended for adults aged 55-80 years who are at high risk for developing lung cancer because of a history of smoking. A yearly low-dose CT scan of the lungs is recommended for people who have at least a 30-pack-year history of smoking and are current smokers or have quit within the past 15 years. A pack year of smoking is smoking an average of 1 pack of cigarettes a day for 1 year (for example, a 30-pack-year history of smoking could mean smoking 1 pack a day for 30 years or 2 packs a day for 15 years). Yearly screening should continue until the smoker has stopped smoking for at least 15 years. Yearly screening should be stopped for people who develop a health problem that would prevent them from having lung cancer treatment.  If you choose to drink alcohol, do not have more than 2 drinks per day. One drink is considered to be 12 oz (360 mL) of beer, 5 oz (150 mL) of wine,   1.5 oz (45 mL) of liquor.  Avoid the use of street drugs. Do not share needles with anyone. Ask for help if you need support or instructions about stopping the use of drugs.  High blood pressure causes heart disease and increases the risk of stroke. High blood pressure is more likely to develop in:  People who have blood pressure in the end of the normal range (100-139/85-89 mm Hg).  People who are overweight or obese.  People who are African American.  If you are [id number]  years of age, have your blood pressure checked every 3-5 years. If you are 58 years of age or older, have your blood pressure checked every year. You should have your blood pressure measured twice-once when you are at a hospital or clinic, and once when you are not at a hospital or clinic. Record the average of the two measurements. To check your blood pressure when you are not at a hospital or clinic, you can use:  An automated blood pressure machine at a pharmacy.  A home blood pressure monitor.  If you are 245-58 years old, ask your health care provider if you should take aspirin to prevent heart disease.  Diabetes screening involves taking a blood sample to check your fasting blood sugar level. This should be done once every 3 years after age 58 if you are at a normal weight and without risk factors for diabetes. Testing should be considered at a younger age or be carried out more frequently if you are overweight and have at least 1 risk factor for diabetes.  Colorectal cancer can be detected and often prevented. Most routine colorectal cancer screening begins at the age of 58 and continues through age 58. However, your health care provider may recommend screening at an earlier age if you have risk factors for colon cancer. On a yearly basis, your health care provider may provide home test kits to check for hidden blood in the stool. A small camera at the end of a tube may be used to directly examine the colon (sigmoidoscopy or colonoscopy) to detect the earliest forms of colorectal cancer. Talk to your health care provider about this at age 58 when routine screening begins. A direct exam of the colon should be repeated every 5-10 years through age 58, unless early forms of precancerous polyps or small growths are found.  People who are at an increased risk for hepatitis B should be screened for this virus. You are considered at high risk for hepatitis B if:  You were born in a country where  hepatitis B occurs often. Talk with your health care provider about which countries are considered high risk.  Your parents were born in a high-risk country and you have not received a shot to protect against hepatitis B (hepatitis B vaccine).  You have HIV or AIDS.  You use needles to inject street drugs.  You live with, or have sex with, someone who has hepatitis B.  You are a man who has sex with other men (MSM).  You get hemodialysis treatment.  You take certain medicines for conditions like cancer, organ transplantation, and autoimmune conditions.  Hepatitis C blood testing is recommended for all people born from [id number] through 1965 and any individual with known risk factors for hepatitis C.  Healthy men should no longer receive prostate-specific antigen (PSA) blood tests as part of routine cancer screening. Talk to your health care provider about prostate cancer screening.  Testicular cancer screening is not  recommended for adolescents or adult males who have no symptoms. Screening includes self-exam, a health care provider exam, and other screening tests. Consult with your health care provider about any symptoms you have or any concerns you have about testicular cancer.  Practice safe sex. Use condoms and avoid high-risk sexual practices to reduce the spread of sexually transmitted infections (STIs).  You should be screened for STIs, including gonorrhea and chlamydia if:  You are sexually active and are younger than 24 years.  You are older than 24 years, and your health care provider tells you that you are at risk for this type of infection.  Your sexual activity has changed since you were last screened, and you are at an increased risk for chlamydia or gonorrhea. Ask your health care provider if you are at risk.  If you are at risk of being infected with HIV, it is recommended that you take a prescription medicine daily to prevent HIV infection. This is called pre-exposure  prophylaxis (PrEP). You are considered at risk if:  You are a man who has sex with other men (MSM).  You are a heterosexual man who is sexually active with multiple partners.  You take drugs by injection.  You are sexually active with a partner who has HIV.  Talk with your health care provider about whether you are at high risk of being infected with HIV. If you choose to begin PrEP, you should first be tested for HIV. You should then be tested every 3 months for as long as you are taking PrEP.  Use sunscreen. Apply sunscreen liberally and repeatedly throughout the day. You should seek shade when your shadow is shorter than you. Protect yourself by wearing long sleeves, pants, a wide-brimmed hat, and sunglasses year round whenever you are outdoors.  Tell your health care provider of new moles or changes in moles, especially if there is a change in shape or color. Also, tell your health care provider if a mole is larger than the size of a pencil eraser.  A one-time screening for abdominal aortic aneurysm (AAA) and surgical repair of large AAAs by ultrasound is recommended for men aged 65-75 years who are current or former smokers.  Stay current with your vaccines (immunizations). This information is not intended to replace advice given to you by your health care provider. Make sure you discuss any questions you have with your health care provider. Document Released: 10/08/2007 Document Revised: 05/02/2014 Document Reviewed: 01/13/2015 Elsevier Interactive Patient Education  2017 [employer]

## 2016-04-01 NOTE — Progress Notes (Signed)
Pre visit review using our clinic review tool, if applicable. No additional management support is needed unless otherwise documented below in the visit note. 

## 2016-04-01 NOTE — Assessment & Plan Note (Signed)
Check a1c 

## 2016-04-04 ENCOUNTER — Encounter: Payer: Self-pay | Admitting: Internal Medicine

## 2016-04-04 DIAGNOSIS — E119 Type 2 diabetes mellitus without complications: Secondary | ICD-10-CM | POA: Insufficient documentation

## 2016-04-04 DIAGNOSIS — E1169 Type 2 diabetes mellitus with other specified complication: Secondary | ICD-10-CM | POA: Insufficient documentation

## 2016-04-04 LAB — PSA, TOTAL AND FREE
PSA, % FREE: 38 % (ref >25)
PSA, FREE: 0.3 ng/mL
PSA, TOTAL: 0.8 ng/mL (ref <=4.0)

## 2018-02-23 ENCOUNTER — Ambulatory Visit: Payer: BC Managed Care – PPO | Admitting: Internal Medicine

## 2018-02-23 ENCOUNTER — Encounter: Payer: Self-pay | Admitting: Internal Medicine

## 2018-02-23 ENCOUNTER — Other Ambulatory Visit (INDEPENDENT_AMBULATORY_CARE_PROVIDER_SITE_OTHER): Payer: BC Managed Care – PPO

## 2018-02-23 VITALS — BP 128/82 | HR 75 | Temp 98.5°F | Resp 16 | Ht 68.5 in | Wt 183.8 lb

## 2018-02-23 DIAGNOSIS — M25512 Pain in left shoulder: Secondary | ICD-10-CM | POA: Diagnosis not present

## 2018-02-23 DIAGNOSIS — Z0000 Encounter for general adult medical examination without abnormal findings: Secondary | ICD-10-CM

## 2018-02-23 DIAGNOSIS — E119 Type 2 diabetes mellitus without complications: Secondary | ICD-10-CM | POA: Diagnosis not present

## 2018-02-23 DIAGNOSIS — M25531 Pain in right wrist: Secondary | ICD-10-CM

## 2018-02-23 LAB — COMPREHENSIVE METABOLIC PANEL
ALT: 24 U/L (ref 0–53)
AST: 15 U/L (ref 0–37)
Albumin: 4.7 g/dL (ref 3.5–5.2)
Alkaline Phosphatase: 64 U/L (ref 39–117)
BILIRUBIN TOTAL: 0.5 mg/dL (ref 0.2–1.2)
BUN: 17 mg/dL (ref 6–23)
CHLORIDE: 102 meq/L (ref 96–112)
CO2: 28 meq/L (ref 19–32)
Calcium: 9.9 mg/dL (ref 8.4–10.5)
Creatinine, Ser: 0.95 mg/dL (ref 0.40–1.50)
GFR: 85.94 mL/min (ref >60.00)
Glucose, Bld: 108 mg/dL — ABNORMAL HIGH (ref 70–99)
POTASSIUM: 4.1 meq/L (ref 3.5–5.1)
Sodium: 140 mEq/L (ref 135–145)
Total Protein: 7.9 g/dL (ref 6.0–8.3)

## 2018-02-23 LAB — CBC WITH DIFFERENTIAL/PLATELET
BASOS ABS: 0.1 10*3/uL (ref 0.0–0.1)
BASOS PCT: 1.2 % (ref 0.0–3.0)
EOS PCT: 4 % (ref 0.0–5.0)
Eosinophils Absolute: 0.4 10*3/uL (ref 0.0–0.7)
HCT: 44.6 % (ref 39.0–52.0)
Hemoglobin: 15.5 g/dL (ref 13.0–17.0)
LYMPHS PCT: 26.4 % (ref 12.0–46.0)
Lymphs Abs: 2.7 10*3/uL (ref 0.7–4.0)
MCHC: 34.7 g/dL (ref 30.0–36.0)
MCV: 87.1 fl (ref 78.0–100.0)
MONOS PCT: 10.2 % (ref 3.0–12.0)
Monocytes Absolute: 1 10*3/uL (ref 0.1–1.0)
Neutro Abs: 5.9 10*3/uL (ref 1.4–7.7)
Neutrophils Relative %: 58.2 % (ref 43.0–77.0)
Platelets: 289 10*3/uL (ref 150.0–400.0)
RBC: 5.13 Mil/uL (ref 4.22–5.81)
RDW: 13.2 % (ref 11.5–15.5)
WBC: 10.2 10*3/uL (ref 4.0–10.5)

## 2018-02-23 LAB — LIPID PANEL
CHOL/HDL RATIO: 6
CHOLESTEROL: 208 mg/dL — AB (ref 0–200)
HDL: 36.6 mg/dL — ABNORMAL LOW (ref >39.00)
NONHDL: 171.68
TRIGLYCERIDES: 361 mg/dL — AB (ref 0.0–149.0)
VLDL: 72.2 mg/dL — AB (ref 0.0–40.0)

## 2018-02-23 LAB — HEMOGLOBIN A1C: Hgb A1c MFr Bld: 6.3 % (ref 4.6–6.5)

## 2018-02-23 LAB — MICROALBUMIN / CREATININE URINE RATIO
Creatinine,U: 151.8 mg/dL
Microalb Creat Ratio: 0.9 mg/g (ref 0.0–30.0)
Microalb, Ur: 1.4 mg/dL (ref 0.0–1.9)

## 2018-02-23 LAB — TSH: TSH: 2.49 u[IU]/mL (ref 0.35–4.50)

## 2018-02-23 LAB — LDL CHOLESTEROL, DIRECT: Direct LDL: 154 mg/dL

## 2018-02-23 NOTE — Patient Instructions (Addendum)
Tests ordered today. Your results will be released to MyChart (or called to you) after review, usually within 72hours after test completion. If any changes need to be made, you will be notified at that same time.  All other Health Maintenance issues reviewed.   All recommended immunizations and age-appropriate screenings are up-to-date or discussed.  No immunizations administered today.   Medications reviewed and updated.  Changes include :   none   A referral was ordered for orthopedics - Dr Amanda Pea.    Please followup in one year    Health Maintenance, Male A healthy lifestyle and preventive care is important for your health and wellness. Ask your health care provider about what schedule of regular examinations is right for you. What should I know about weight and diet? Eat a Healthy Diet  Eat plenty of vegetables, fruits, whole grains, low-fat dairy products, and lean protein.  Do not eat a lot of foods high in solid fats, added sugars, or salt.  Maintain a Healthy Weight Regular exercise can help you achieve or maintain a healthy weight. You should:  Do at least 150 minutes of exercise each week. The exercise should increase your heart rate and make you sweat (moderate-intensity exercise).  Do strength-training exercises at least twice a week.  Watch Your Levels of Cholesterol and Blood Lipids  Have your blood tested for lipids and cholesterol every 5 years starting at 60 years of age. If you are at high risk for heart disease, you should start having your blood tested when you are 60 years old. You may need to have your cholesterol levels checked more often if: ? Your lipid or cholesterol levels are high. ? You are older than 60 years of age. ? You are at high risk for heart disease.  What should I know about cancer screening? Many types of cancers can be detected early and may often be prevented. Lung Cancer  You should be screened every year for lung cancer  if: ? You are a current smoker who has smoked for at least 30 years. ? You are a former smoker who has quit within the past 15 years.  Talk to your health care provider about your screening options, when you should start screening, and how often you should be screened.  Colorectal Cancer  Routine colorectal cancer screening usually begins at 60 years of age and should be repeated every 5-10 years until you are 60 years old. You may need to be screened more often if early forms of precancerous polyps or small growths are found. Your health care provider may recommend screening at an earlier age if you have risk factors for colon cancer.  Your health care provider may recommend using home test kits to check for hidden blood in the stool.  A small camera at the end of a tube can be used to examine your colon (sigmoidoscopy or colonoscopy). This checks for the earliest forms of colorectal cancer.  Prostate and Testicular Cancer  Depending on your age and overall health, your health care provider may do certain tests to screen for prostate and testicular cancer.  Talk to your health care provider about any symptoms or concerns you have about testicular or prostate cancer.  Skin Cancer  Check your skin from head to toe regularly.  Tell your health care provider about any new moles or changes in moles, especially if: ? There is a change in a mole's size, shape, or color. ? You have a mole that is  larger than a pencil eraser.  Always use sunscreen. Apply sunscreen liberally and repeat throughout the day.  Protect yourself by wearing long sleeves, pants, a wide-brimmed hat, and sunglasses when outside.  What should I know about heart disease, diabetes, and high blood pressure?  If you are 15-64 years of age, have your blood pressure checked every 3-5 years. If you are 35 years of age or older, have your blood pressure checked every year. You should have your blood pressure measured  twice-once when you are at a hospital or clinic, and once when you are not at a hospital or clinic. Record the average of the two measurements. To check your blood pressure when you are not at a hospital or clinic, you can use: ? An automated blood pressure machine at a pharmacy. ? A home blood pressure monitor.  Talk to your health care provider about your target blood pressure.  If you are between 27-41 years old, ask your health care provider if you should take aspirin to prevent heart disease.  Have regular diabetes screenings by checking your fasting blood sugar level. ? If you are at a normal weight and have a low risk for diabetes, have this test once every three years after the age of 71. ? If you are overweight and have a high risk for diabetes, consider being tested at a younger age or more often.  A one-time screening for abdominal aortic aneurysm (AAA) by ultrasound is recommended for men aged 61-75 years who are current or former smokers. What should I know about preventing infection? Hepatitis B If you have a higher risk for hepatitis B, you should be screened for this virus. Talk with your health care provider to find out if you are at risk for hepatitis B infection. Hepatitis C Blood testing is recommended for:  Everyone born from [birth year] through 1963.  Anyone with known risk factors for hepatitis C.  Sexually Transmitted Diseases (STDs)  You should be screened each year for STDs including gonorrhea and chlamydia if: ? You are sexually active and are younger than 60 years of age. ? You are older than 60 years of age and your health care provider tells you that you are at risk for this type of infection. ? Your sexual activity has changed since you were last screened and you are at an increased risk for chlamydia or gonorrhea. Ask your health care provider if you are at risk.  Talk with your health care provider about whether you are at high risk of being infected with HIV.  Your health care provider may recommend a prescription medicine to help prevent HIV infection.  What else can I do?  Schedule regular health, dental, and eye exams.  Stay current with your vaccines (immunizations).  Do not use any tobacco products, such as cigarettes, chewing tobacco, and e-cigarettes. If you need help quitting, ask your health care provider.  Limit alcohol intake to no more than 2 drinks per day. One drink equals 12 ounces of beer, 5 ounces of wine, or 1 ounces of hard liquor.  Do not use street drugs.  Do not share needles.  Ask your health care provider for help if you need support or information about quitting drugs.  Tell your health care provider if you often feel depressed.  Tell your health care provider if you have ever been abused or do not feel safe at home. This information is not intended to replace advice given to you by your health care  provider. Make sure you discuss any questions you have with your health care provider. Document Released: 10/08/2007 Document Revised: 12/09/2015 Document Reviewed: 01/13/2015 Elsevier Interactive Patient Education  [employer].

## 2018-02-23 NOTE — Progress Notes (Signed)
Subjective:    Patient ID: Jack Wood, male    DOB: 05/03/1955, 62 y.o.   MRN: [id number]  HPI He is here for a physical exam.   Right wrist pain:  It started a while ago and is getting worse.  It hurts with many activities.  He feels limited by the pain.   He is interested in seeing Dr Amanda Pea.    Left shoulder aches - it started a few weeks ago.  It aches at night and is hard to sleep.  No injury.  He is interested in seeing ortho.    He has no other concerns.  He lost weight, but did regain some of it.    Medications and allergies reviewed with patient and updated if appropriate.  Patient Active Problem List   Diagnosis Date Noted  . Diabetes (HCC) 04/04/2016  . Acne rosacea     Current Outpatient Medications on File Prior to Visit  Medication Sig Dispense Refill  . PICATO 0.015 % GEL APPLY TO FACE EVERY DAY  1   No current facility-administered medications on file prior to visit.     Past Medical History:  Diagnosis Date  . Acne rosacea   . Diverticula, colon    diverticulitis in 08/2010, 11/2014    Past Surgical History:  Procedure Laterality Date  . APPENDECTOMY  12/2009    Social History   Socioeconomic History  . Marital status: Married    Spouse name: Not on file  . Number of children: Not on file  . Years of education: Not on file  . Highest education level: Not on file  Occupational History  . Not on file  Social Needs  . Financial resource strain: Not on file  . Food insecurity:    Worry: Not on file    Inability: Not on file  . Transportation needs:    Medical: Not on file    Non-medical: Not on file  Tobacco Use  . Smoking status: Never Smoker  . Smokeless tobacco: Never Used  Substance and Sexual Activity  . Alcohol use: Yes    Alcohol/week: 0.0 standard drinks    Comment: social  . Drug use: No  . Sexual activity: Not on file  Lifestyle  . Physical activity:    Days per week: Not on file    Minutes per session: Not on file    . Stress: Not on file  Relationships  . Social connections:    Talks on phone: Not on file    Gets together: Not on file    Attends religious service: Not on file    Active member of club or organization: Not on file    Attends meetings of clubs or organizations: Not on file    Relationship status: Not on file  Other Topics Concern  . 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 Osteoporosis Mother   . Valvular heart disease Mother   . Coronary artery disease Father   . Valvular heart disease Father   . Hypertension Father   . Cirrhosis Sister 67       EtOH related    Review of Systems  Constitutional: Negative for chills, fatigue and fever.  Eyes: Negative for visual disturbance.  Respiratory: Negative for cough, shortness of breath and wheezing.   Cardiovascular: Negative for chest pain, palpitations and  leg swelling.  Gastrointestinal: Negative for abdominal pain, blood in stool, constipation, diarrhea and nausea.  Genitourinary: Negative for difficulty urinating, dysuria and hematuria.  Musculoskeletal: Positive for arthralgias.  Skin: Negative for color change and rash.  Neurological: Negative for dizziness, light-headedness, numbness and headaches.  Psychiatric/Behavioral: Negative for dysphoric mood and sleep disturbance. The patient is not nervous/anxious.        Objective:   Vitals:   02/23/18 1445  BP: 128/82  Pulse: 75  Resp: 16  Temp: 98.5 F (36.9 C)  SpO2: 98%   Filed Weights   02/23/18 1445  Weight: 183 lb 12.8 oz (83.4 kg)   Body mass index is 27.54 kg/m.  Wt Readings from Last 3 Encounters:  02/23/18 183 lb 12.8 oz (83.4 kg)  04/01/16 191 lb (86.6 kg)  04/09/15 192 lb 8 oz (87.3 kg)     Physical Exam Constitutional: He appears well-developed and well-nourished. No distress.  HENT:  Head: Normocephalic and atraumatic.  Right  Ear: External ear normal.  Left Ear: External ear normal.  Mouth/Throat: Oropharynx is clear and moist.  Normal ear canals and TM b/l  Eyes: Conjunctivae and EOM are normal.  Neck: Neck supple. No tracheal deviation present. No thyromegaly present.  No carotid bruit  Cardiovascular: Normal rate, regular rhythm, normal heart sounds and intact distal pulses.   No murmur heard. Pulmonary/Chest: Effort normal and breath sounds normal. No respiratory distress. He has no wheezes. He has no rales.  Abdominal: Soft. He exhibits no distension. There is no tenderness.  Genitourinary: deferred  Musculoskeletal: He exhibits no edema.  Lymphadenopathy:   He has no cervical adenopathy.  Skin: Skin is warm and dry. He is not diaphoretic.  Psychiatric: He has a normal mood and affect. His behavior is normal.         Assessment & Plan:   Physical exam: Screening blood work  ordered Immunizations    Discussed shingrix, pneumonia vaccines Colonoscopy    Up to date  Eye exams  Due - will schedule EKG   Done 03/2015 Exercise  Active, house renovations Weight   Has lost weight Skin  No concerns Substance abuse  none  See Problem List for Assessment and Plan of chronic medical problems.

## 2018-02-24 DIAGNOSIS — M25512 Pain in left shoulder: Secondary | ICD-10-CM | POA: Insufficient documentation

## 2018-02-24 DIAGNOSIS — M25531 Pain in right wrist: Secondary | ICD-10-CM | POA: Insufficient documentation

## 2018-02-24 NOTE — Assessment & Plan Note (Signed)
Referred to ortho

## 2018-02-24 NOTE — Assessment & Plan Note (Signed)
Diet controlled Check a1c, urine micro Low sugar / carb diet Stressed regular exercise

## 2018-02-26 LAB — PSA, TOTAL AND FREE
PSA, % Free: 50 % (calc) (ref >25)
PSA, FREE: 0.4 ng/mL
PSA, Total: 0.8 ng/mL (ref <=4.0)

## 2018-02-27 ENCOUNTER — Encounter: Payer: Self-pay | Admitting: Internal Medicine

## 2018-02-27 DIAGNOSIS — E1169 Type 2 diabetes mellitus with other specified complication: Secondary | ICD-10-CM | POA: Insufficient documentation

## 2018-02-27 DIAGNOSIS — E785 Hyperlipidemia, unspecified: Secondary | ICD-10-CM | POA: Insufficient documentation

## 2018-03-06 NOTE — Telephone Encounter (Signed)
Copied from CRM [id number]. Topic: Referral - Status >> Mar 06, 2018  3:57 PM Debroah Loop wrote: Reason for CRM: Patient just contacted Dr. Amanda Pea at [facility] and they haven't received the referral placed on 02/23/2018 by [place]. Please resend referral and per pt request, call him to notify him once it's been recent.

## 2018-06-08 ENCOUNTER — Encounter (HOSPITAL_COMMUNITY): Payer: Self-pay | Admitting: Emergency Medicine

## 2018-06-08 ENCOUNTER — Ambulatory Visit (HOSPITAL_COMMUNITY)
Admission: EM | Admit: 2018-06-08 | Discharge: 2018-06-08 | Disposition: A | Discharge status: Home / Self Care | Payer: BC Managed Care – PPO | Attending: Internal Medicine | Admitting: Internal Medicine

## 2018-06-08 DIAGNOSIS — J069 Acute upper respiratory infection, unspecified: Secondary | ICD-10-CM

## 2018-06-08 DIAGNOSIS — B9789 Other viral agents as the cause of diseases classified elsewhere: Secondary | ICD-10-CM

## 2018-06-08 MED ORDER — BENZONATATE 100 MG PO CAPS: 100.0000 mg | ORAL_CAPSULE | Freq: Three times a day (TID) | ORAL | 0 refills | Status: DC

## 2018-06-08 NOTE — ED Provider Notes (Signed)
[facility]    CSN: [id number] Arrival date & time: 06/08/18  1107     History   Chief Complaint Chief Complaint  Patient presents with  . Cough    HPI Jack Wood is a 63 y.o. male with a history of acne rosacea comes to the urgent care with complaints of cough of 1 week duration.  Patient started having upper respiratory infection symptoms with cough a week ago.  He denied any fever or chills.  Cough is usually worse in the morning and has not had any relief with Mucinex.  Patient denies any chest pain or chest pressure.  No fever or chills.  No nausea or vomiting.  His appetite is preserved.  HPI  Past Medical History:  Diagnosis Date  . Acne rosacea   . Diverticula, colon    diverticulitis in 08/2010, 11/2014    Patient Active Problem List   Diagnosis Date Noted  . Hyperlipidemia 02/27/2018  . Right wrist pain 02/24/2018  . Acute pain of left shoulder 02/24/2018  . Diabetes (HCC) 04/04/2016  . Acne rosacea     Past Surgical History:  Procedure Laterality Date  . APPENDECTOMY  12/2009       Home Medications    Prior to Admission medications   Medication Sig Start Date End Date Taking? Authorizing Provider  benzonatate (TESSALON) 100 MG capsule Take 1 capsule (100 mg total) by mouth every 8 (eight) hours. 06/08/18   Merrilee Jansky, MD  PICATO 0.015 % GEL APPLY TO FACE EVERY DAY 02/19/18   [provider]  SULFACLEANSE 8/4 8-4 % SUSP  02/19/18   [provider]    Family History Family History  Problem Relation Age of Onset  . Osteoporosis Mother   . Valvular heart disease Mother   . Coronary artery disease Father   . Valvular heart disease Father   . Hypertension Father   . Cirrhosis Sister 29       EtOH related    Social History Social History   Tobacco Use  . Smoking status: Never Smoker  . Smokeless tobacco: Never Used  Substance Use Topics  . Alcohol use: Yes    Alcohol/week: 0.0 standard drinks   Comment: social  . Drug use: No     Allergies   Patient has no known allergies.   Review of Systems Review of Systems  Constitutional: Negative for activity change and appetite change.  HENT: Positive for congestion and rhinorrhea. Negative for ear discharge, ear pain, sore throat and voice change.   Eyes: Negative for pain and itching.  Respiratory: Positive for cough and wheezing. Negative for chest tightness and shortness of breath.   Gastrointestinal: Negative for abdominal distention and abdominal pain.  Musculoskeletal: Negative for arthralgias, back pain and gait problem.     Physical Exam Triage Vital Signs ED Triage Vitals  Enc Vitals Group     BP 06/08/18 1129 (!) 159/92     Pulse Rate 06/08/18 1129 82     Resp 06/08/18 1129 16     Temp 06/08/18 1129 98.1 F (36.7 C)     Temp src --      SpO2 06/08/18 1129 98 %     Weight --      Height --      Head Circumference --      Peak Flow --      Pain Score 06/08/18 1127 0     Pain Loc --  Pain Edu? --      Excl. in GC? --    No data found.  Updated Vital Signs BP (!) 159/92   Pulse 82   Temp 98.1 F (36.7 C)   Resp 16   SpO2 98%   Visual Acuity Right Eye Distance:   Left Eye Distance:   Bilateral Distance:    Right Eye Near:   Left Eye Near:    Bilateral Near:     Physical Exam Constitutional:      General: He is not in acute distress.    Appearance: Normal appearance. He is not ill-appearing.  HENT:     Right Ear: Tympanic membrane normal.     Left Ear: Tympanic membrane normal.     Mouth/Throat:     Mouth: Mucous membranes are moist.     Pharynx: Oropharynx is clear. No oropharyngeal exudate or posterior oropharyngeal erythema.  Neck:     Musculoskeletal: No neck rigidity.  Cardiovascular:     Rate and Rhythm: Normal rate and regular rhythm.     Pulses: Normal pulses.  Pulmonary:     Effort: Pulmonary effort is normal. No respiratory distress.     Breath sounds: No wheezing or  rales.  Lymphadenopathy:     Cervical: No cervical adenopathy.  Neurological:     Mental Status: He is alert.      UC Treatments / Results  Labs (all labs ordered are listed, but only abnormal results are displayed) Labs Reviewed - No data to display  EKG None  Radiology No results found.  Procedures Procedures (including critical care time)  Medications Ordered in UC Medications - No data to display  Initial Impression / Assessment and Plan / UC Course  I have reviewed the triage vital signs and the nursing notes.  Pertinent labs & imaging results that were available during my care of the patient were reviewed by me and considered in my medical decision making (see chart for details).     1.  Viral illness with cough: Tessalon Perles Continue oral fluid intake Return to urgent care if fever develops. Final Clinical Impressions(s) / UC Diagnoses   Final diagnoses:  Viral URI with cough   Discharge Instructions   None    ED Prescriptions    Medication Sig Dispense Auth. Provider   benzonatate (TESSALON) 100 MG capsule Take 1 capsule (100 mg total) by mouth every 8 (eight) hours. 21 capsule Lamptey, Britta Mccreedy, MD     Controlled Substance Prescriptions [place] Controlled Substance Registry consulted? Not Applicable   Merrilee Jansky, MD 06/08/18 1201

## 2018-06-08 NOTE — ED Triage Notes (Signed)
Pt c/o cough x1 week, states when he takes a shower it helps break up the cough, taking mucinex. No fever.

## 2018-06-15 ENCOUNTER — Ambulatory Visit: Payer: BC Managed Care – PPO | Admitting: Internal Medicine

## 2018-06-15 ENCOUNTER — Encounter: Payer: Self-pay | Admitting: Internal Medicine

## 2018-06-15 ENCOUNTER — Ambulatory Visit: Payer: Self-pay | Admitting: Internal Medicine

## 2018-06-15 DIAGNOSIS — R05 Cough: Secondary | ICD-10-CM

## 2018-06-15 DIAGNOSIS — R059 Cough, unspecified: Secondary | ICD-10-CM | POA: Insufficient documentation

## 2018-06-15 MED ORDER — METHYLPREDNISOLONE ACETATE 40 MG/ML IJ SUSP
40.0000 mg | Freq: Once | INTRAMUSCULAR | Status: AC
  Administered 2018-06-15: 40 mg via INTRAMUSCULAR

## 2018-06-15 NOTE — Progress Notes (Signed)
   Subjective:   Patient ID: Jack Wood, male    DOB: 11-28-1955, 63 y.o.   MRN: [id number]  HPI The patient is a 63 y.o. man coming in for cold symptoms. Started about 2 weeks ago. Started with more upper respiratory symptoms. Main symptoms were: cough, nasal drainage, headaches. Now just having cough mostly and non-productive. Denies fevers or chills or appetite change. Overall it is stable but not improving much. Has tried tessalon perles which did help some.   Review of Systems  Constitutional: Positive for activity change and fatigue. Negative for appetite change, chills, fever and unexpected weight change.  HENT: Positive for congestion. Negative for ear discharge, ear pain, postnasal drip, rhinorrhea, sinus pressure, sinus pain, sneezing, sore throat, tinnitus, trouble swallowing and voice change.   Eyes: Negative.   Respiratory: Positive for cough. Negative for chest tightness, shortness of breath and wheezing.   Cardiovascular: Negative.   Gastrointestinal: Negative.   Musculoskeletal: Negative.   Neurological: Negative.     Objective:  Physical Exam Constitutional:      Appearance: He is well-developed.  HENT:     Head: Normocephalic and atraumatic.     Comments: Oropharynx with redness and clear drainage, nose with swollen turbinates, TMs normal bilaterally.  Neck:     Musculoskeletal: Normal range of motion.     Thyroid: No thyromegaly.  Cardiovascular:     Rate and Rhythm: Normal rate and regular rhythm.  Pulmonary:     Effort: Pulmonary effort is normal. No respiratory distress.     Breath sounds: Wheezing present. No rales.     Comments: Minimal wheezing which partially clears with coughing Abdominal:     Palpations: Abdomen is soft.  Musculoskeletal:        General: Tenderness present.  Lymphadenopathy:     Cervical: No cervical adenopathy.  Skin:    General: Skin is warm and dry.  Neurological:     Mental Status: He is alert and oriented to person,  place, and time.     Vitals:   06/15/18 1010  BP: 110/80  Pulse: 62  Temp: (!) 97.5 F (36.4 C)  TempSrc: Oral  Weight: 178 lb (80.7 kg)  Height: 5' 8.5" (1.74 m)    Assessment & Plan:  Depo-medrol 40 mg IM

## 2018-06-15 NOTE — Patient Instructions (Signed)
We have given you a shot today which should help you to cough less.   Most people after a viral cold can cough for 2-3 weeks after feeling better otherwise.   If you start having breathing problems or fevers or chills let us know.

## 2018-06-15 NOTE — Assessment & Plan Note (Signed)
Seen at urgent care 1 week ago and had tessalon perles. Some mild wheezing on exam and given depo-medrol 40 mg IM. Antibiotics are not indicated today.

## 2019-02-08 ENCOUNTER — Telehealth: Payer: Self-pay

## 2019-02-08 ENCOUNTER — Encounter: Payer: Self-pay | Admitting: Internal Medicine

## 2019-02-08 DIAGNOSIS — G4733 Obstructive sleep apnea (adult) (pediatric): Secondary | ICD-10-CM

## 2019-02-08 NOTE — Telephone Encounter (Signed)
Referral ordered - neuro

## 2019-02-08 NOTE — Telephone Encounter (Signed)
Copied from [place] [id number]. Topic: Referral - Request for Referral >> Feb 08, 2019 12:45 PM Erick Blinks wrote: Has patient seen PCP for this complaint? Yes *If NO, is insurance requiring patient see PCP for this issue before PCP can refer them? Referral for which specialty: Sleep Study  Preferred provider/office: Highest recommended  Reason for referral: Seeking prescription for CPAP

## 2019-02-14 ENCOUNTER — Ambulatory Visit: Payer: BC Managed Care – PPO | Admitting: Neurology

## 2019-02-14 ENCOUNTER — Other Ambulatory Visit: Payer: Self-pay

## 2019-02-14 ENCOUNTER — Encounter: Payer: Self-pay | Admitting: Neurology

## 2019-02-14 VITALS — BP 141/88 | HR 62 | Temp 98.0°F | Ht 69.0 in | Wt 192.0 lb

## 2019-02-14 DIAGNOSIS — Z820 Family history of epilepsy and other diseases of the nervous system: Secondary | ICD-10-CM | POA: Diagnosis not present

## 2019-02-14 DIAGNOSIS — E663 Overweight: Secondary | ICD-10-CM

## 2019-02-14 DIAGNOSIS — G4733 Obstructive sleep apnea (adult) (pediatric): Secondary | ICD-10-CM | POA: Diagnosis not present

## 2019-02-14 NOTE — Patient Instructions (Addendum)
Thank you for choosing [facility] for your sleep related care! It was nice to meet you today! I appreciate that you entrust me with your sleep related healthcare concerns. I hope, I was able to address at least some of your concerns today, and that I can help you feel reassured and also get better.    Here is what we discussed today and what we came up with as our plan for you:    Based on your symptoms and your exam I believe you may still be at risk for obstructive sleep apnea (aka OSA), and I think we should proceed with a sleep study to determine whether you do or do not have OSA and how severe it is. Even, if you have mild OSA, I may want you to consider treatment with CPAP, as treatment of even borderline or mild sleep apnea can result and improvement of symptoms such as sleep disruption, daytime sleepiness, nighttime bathroom breaks, restless leg symptoms, improvement of headache syndromes, even improved mood disorder.   Please remember, the long-term risks and ramifications of untreated moderate to severe obstructive sleep apnea are: increased Cardiovascular disease, including congestive heart failure, stroke, difficult to control hypertension, treatment resistant obesity, arrhythmias, especially irregular heartbeat commonly known as A. Fib. (atrial fibrillation); even type 2 diabetes has been linked to untreated OSA.   Sleep apnea can cause disruption of sleep and sleep deprivation in most cases, which, in turn, can cause recurrent headaches, problems with memory, mood, concentration, focus, and vigilance. Most people with untreated sleep apnea report excessive daytime sleepiness, which can affect their ability to drive. Please do not drive if you feel sleepy. Patients with sleep apnea developed difficulty initiating and maintaining sleep (aka insomnia).   Having sleep apnea may increase your risk for other sleep disorders, including involuntary behaviors sleep such as sleep  terrors, sleep talking, sleepwalking.    Having sleep apnea can also increase your risk for restless leg syndrome and leg movements at night.   Please note that untreated obstructive sleep apnea may carry additional perioperative morbidity. Patients with significant obstructive sleep apnea (typically, in the moderate to severe degree) should receive, if possible, perioperative PAP (positive airway pressure) therapy and the surgeons and particularly the anesthesiologists should be informed of the diagnosis and the severity of the sleep disordered breathing.   I will likely see you back after your sleep study to go over the test results and where to go from there. We will call you after your sleep study to advise about the results (most likely, you will hear from Kristen, my nurse) and to set up an appointment at the time, as necessary.    Our sleep lab administrative assistant will call you to schedule your sleep study and give you further instructions, regarding the check in process for the sleep study, arrival time, what to bring, when you can expect to leave after the study, etc., and to answer any other logistical questions you may have. If you don't hear back from her by about 2 weeks from now, please feel free to call her direct line at [phone] or you can call our general clinic number, or email us through My Chart.   

## 2019-02-14 NOTE — Progress Notes (Signed)
Subjective:    Patient ID: Jack Wood is a 63 y.o. male.  HPI     Jack FoleySaima Neela Zecca, MD, PhD [facility] [address], [address] [address] [place], [place] [address]  Dear Dr. Lawerance BachBurns, I saw your patient, Jack Wood, upon your kind request in my sleep clinic today for initial consultation of his sleep disorder, in particular, evaluation of his prior diagnosis of obstructive sleep apnea.  The patient is unaccompanied today.  As you know, Jack Wood is a 63 year old right-handed gentleman with an underlying medical history of diabetes, rosacea, diverticulosis, and mildly overweight state, who was previously diagnosed with obstructive sleep apnea.  Sleep study testing was about 12 years ago.  I reviewed your office note from 02/23/2018 and recent email correspondence with the patient.  He had an oral appliance which was uncomfortable.  He has never been on CPAP therapy.  His Epworth sleepiness score is 7 out of 24, fatigue severity score is 17 out of 63.  He reports that sleep study testing was in [place].  We do not have prior test results.  He used the oral appliance for about a year to perhaps, he noticed difficulty tolerating it.  He eventually stopped using it.  His snoring has been stable or may be worse.  He has a sister with sleep apnea who was successfully uses a CPAP machine.  He lives with his wife, they have grown children, 1 son, 1 daughter, no grandchildren.  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  His bedtime fluctuates now since his retirement.  He stays busy, he is active.  Rise time generally speaking is always early, between 6 and 630, he denies night to night nocturia or morning headaches.  He is a non-smoker and drinks alcohol about 5 days out of the week, caffeine in the form of coffee, 2 cups/day, typically no soda or tea.  They have 2 dogs and 1 cat in the household, typically in the bedroom and on the bed with them.  He would be  willing to get reevaluated for his sleep apnea and consider CPAP therapy.   Her Past Medical History Is Significant For: Past Medical History:  Diagnosis Date  . Acne rosacea   . Diverticula, colon    diverticulitis in 08/2010, 11/2014    His Past Surgical History Is Significant For: Past Surgical History:  Procedure Laterality Date  . APPENDECTOMY  12/2009    His Family History Is Significant For: Family History  Problem Relation Age of Onset  . Osteoporosis Mother   . Valvular heart disease Mother   . Coronary artery disease Father   . Valvular heart disease Father   . Hypertension Father   . Cirrhosis Sister [id number]       EtOH related    His Social History Is Significant For: Social History   Socioeconomic History  . Marital status: Married    Spouse name: Not on file  . Number of children: Not on file  . Years of education: Not on file  . Highest education level: Not on file  Occupational History  . Not on file  Social Needs  . Financial resource strain: Not on file  . Food insecurity    Worry: Not on file    Inability: Not on file  . Transportation needs    Medical: Not on file    Non-medical: Not on file  Tobacco Use  . Smoking status: Never Smoker  .  Smokeless tobacco: Never Used  Substance and Sexual Activity  . Alcohol use: Yes    Alcohol/week: 0.0 standard drinks    Comment: social  . Drug use: No  . Sexual activity: Not on file  Lifestyle  . Physical activity    Days per week: Not on file    Minutes per session: Not on file  . Stress: Not on file  Relationships  . Social Herbalist on phone: Not on file    Gets together: Not on file    Attends religious service: Not on file    Active member of club or organization: Not on file    Attends meetings of clubs or organizations: Not on file    Relationship status: Not on file  Other Topics Concern  . Not on file  Social History Narrative   Married, lives with wife   CIO @ [employer]  2010-present    His Allergies Are:  No Known Allergies:   His Current Medications Are:  Outpatient Encounter Medications as of 02/14/2019  Medication Sig  . SULFACLEANSE 8/4 8-4 % SUSP    No facility-administered encounter medications on file as of 02/14/2019.   :  Review of Systems:  Out of a complete 14 point review of systems, all are reviewed and negative with the exception of these symptoms as listed below: Review of Systems  Neurological:       Pt presents today to discuss his sleep. Pt had a sleep study about 12 years ago and treated his osa with a oral appliance. The OA was uncomfortable so he stopped using and it and now is interested in a cpap. Pt does endorse snoring.  Epworth Sleepiness Scale 0= would never doze 1= slight chance of dozing 2= moderate chance of dozing 3= high chance of dozing  Sitting and reading: 1 Watching TV:3 Sitting inactive in a public place (ex. Theater or meeting): 0 As a passenger in a car for an hour without a break: 0 Lying down to rest in the afternoon: 2 Sitting and talking to someone: 0 Sitting quietly after lunch (no alcohol): 1 In a car, while stopped in traffic: 0 Total: 7     Objective:  Neurological Exam  Physical Exam Physical Examination:   Vitals:   02/14/19 1108  BP: (!) 141/88  Pulse: 62  Temp: 98 F (36.7 C)    General Examination: The patient is a very pleasant 63 y.o. male in no acute distress. He appears well-developed and well-nourished and well groomed.   HEENT: Normocephalic, atraumatic, pupils are equal, round and reactive to light. Extraocular tracking is well preserved, hearing is grossly intact.  Face is symmetric with normal facial animation.  Mild to moderate airway crowding secondary to small airway opening, slightly longer uvula, tonsils of about 1+, Mallampati class II, tongue protrudes centrally and palate elevates symmetrically.  Neck circumference is 16-1/2 inches.  He has a moderate  overbite.No carotid bruits.  Chest: Clear to auscultation without wheezing, rhonchi or crackles noted.  Heart: S1+S2+0, regular and normal without murmurs, rubs or gallops noted.   Abdomen: Soft, non-tender and non-distended with normal bowel sounds appreciated on auscultation.  Extremities: There is no pitting edema in the distal lower extremities bilaterally.  Skin: Warm and dry without trophic changes noted.  Musculoskeletal: exam reveals no obvious joint deformities, tenderness or joint swelling or erythema.   Neurologically:  Mental status: The patient is awake, alert and oriented in all 4 spheres. His immediate  and remote memory, attention, language skills and fund of knowledge are appropriate. There is no evidence of aphasia, agnosia, apraxia or anomia. Speech is clear with normal prosody and enunciation. Thought process is linear. Mood is normal and affect is normal.  Cranial nerves II - XII are as described above under HEENT exam. In addition: shoulder shrug is normal with equal shoulder height noted. Motor exam: Normal bulk, strength and tone is noted. There is no tremor, Romberg is negative. Fine motor skills and coordination: intact with normal finger taps, normal hand movements, normal rapid alternating patting, normal foot taps and normal foot agility.  Cerebellar testing: No dysmetria or intention tremor on finger to nose testing. Heel to shin is unremarkable bilaterally. There is no truncal or gait ataxia.  Sensory exam: intact to light touch.  Gait, station and balance: He stands easily. No veering to one side is noted. No leaning to one side is noted. Posture is age-appropriate and stance is narrow based. Gait shows normal stride length and normal pace. No problems turning are noted.  Tandem walk is unremarkable.  Assessment and Plan:  In summary, Jack Wood is a very pleasant 63 y.o.-year old male with an underlying medical history of diabetes, rosacea,  diverticulosis, and mildly overweight state, who Presents for evaluation of his prior diagnosis of obstructive sleep apnea of about 12 years ago.  Sleep study test results are not available today, [place], he has never been on CPAP therapy but would be willing to consider it.  He had tried an oral appliance years ago but found it uncomfortable, he does recall it was effective at the time. I had a long chat with the patient about my findings and the diagnosis of OSA, its prognosis and treatment options. We talked about medical treatments, surgical interventions and non-pharmacological approaches. I explained in particular the risks and ramifications of untreated moderate to severe OSA, especially with respect to developing cardiovascular disease down the Road, including congestive heart failure, difficult to treat hypertension, cardiac arrhythmias, or stroke. Even type 2 diabetes has, in part, been linked to untreated OSA. Symptoms of untreated OSA include daytime sleepiness, memory problems, mood irritability and mood disorder such as depression and anxiety, lack of energy, as well as recurrent headaches, especially morning headaches. We talked about trying to maintain a healthy lifestyle in general, as well as the importance of weight control. We also talked about the importance of good sleep hygiene. I recommended the following at this time: sleep study.  I explained the sleep test procedure to the patient and also outlined possible surgical and non-surgical treatment options of OSA, including the use of a custom-made dental device (which would require a referral to a specialist dentist or oral surgeon), upper airway surgical options (which would involve a referral to an ENT surgeon). I also explained the CPAP treatment option to the patient, who indicated that he would be willing to try CPAP if the need arises. I explained the importance of being compliant with PAP treatment, not only  for insurance purposes but primarily to improve His symptoms, and for the patient's long term health benefit, including to reduce His cardiovascular risks. I answered all his questions today and the patient was in agreement. I plan to see him back after the sleep study is completed and encouraged him to call with any interim questions, concerns, problems or updates.   Thank you very much for allowing me to participate in the care of  this nice patient. If I can be of any further assistance to you please do not hesitate to call me at [phone].  Sincerely,   Star Age, MD, PhD

## 2019-03-04 ENCOUNTER — Other Ambulatory Visit: Payer: Self-pay

## 2019-03-04 DIAGNOSIS — Z20822 Contact with and (suspected) exposure to covid-19: Secondary | ICD-10-CM

## 2019-03-05 LAB — NOVEL CORONAVIRUS, NAA: SARS-CoV-2, NAA: NOT DETECTED

## 2019-03-13 ENCOUNTER — Ambulatory Visit (INDEPENDENT_AMBULATORY_CARE_PROVIDER_SITE_OTHER): Payer: BC Managed Care – PPO | Admitting: Neurology

## 2019-03-13 ENCOUNTER — Other Ambulatory Visit: Payer: Self-pay

## 2019-03-13 DIAGNOSIS — G4733 Obstructive sleep apnea (adult) (pediatric): Secondary | ICD-10-CM

## 2019-03-13 DIAGNOSIS — E663 Overweight: Secondary | ICD-10-CM

## 2019-03-13 DIAGNOSIS — Z820 Family history of epilepsy and other diseases of the nervous system: Secondary | ICD-10-CM

## 2019-03-13 DIAGNOSIS — G4761 Periodic limb movement disorder: Secondary | ICD-10-CM

## 2019-03-14 ENCOUNTER — Telehealth: Payer: Self-pay

## 2019-03-14 ENCOUNTER — Encounter: Payer: Self-pay | Admitting: Internal Medicine

## 2019-03-14 DIAGNOSIS — G4733 Obstructive sleep apnea (adult) (pediatric): Secondary | ICD-10-CM | POA: Insufficient documentation

## 2019-03-14 NOTE — Procedures (Signed)
PATIENT'S NAME:  Jack, Wood DOB:      1955/11/02      MR#:    [id number]     DATE OF RECORDING: 03/13/2019 REFERRING M.D.:  Billey Gosling, MD Study Performed:   Baseline Polysomnogram HISTORY: 63 year old man with a history of diabetes, rosacea, diverticulosis, and overweight state, who was previously diagnosed with obstructive sleep apnea. Sleep study testing was about 12 years ago.  He had an oral appliance, which was uncomfortable.  He has never been on CPAP therapy. The patient endorsed the Epworth Sleepiness Scale at 7 points. The patient's weight 192 pounds with a height of 69 (inches), resulting in a BMI of 28.4 kg/m2. The patient's neck circumference measured 16.5 inches.  CURRENT MEDICATIONS: Sulfaclease face wash   PROCEDURE:  This is a multichannel digital polysomnogram utilizing the Somnostar 11.2 system.  Electrodes and sensors were applied and monitored per AASM Specifications.   EEG, EOG, Chin and Limb EMG, were sampled at 200 Hz.  ECG, Snore and Nasal Pressure, Thermal Airflow, Respiratory Effort, CPAP Flow and Pressure, Oximetry was sampled at 50 Hz. Digital video and audio were recorded.      BASELINE STUDY  Lights Out was at 22:00 and Lights On at 05:04.  Total recording time (TRT) was 425 minutes, with a total sleep time (TST) of 377 minutes.   The patient's sleep latency was 41.5 minutes, which is delayed. REM latency was 86.5 minutes, which is normal. The sleep efficiency was 88.7 %.     SLEEP ARCHITECTURE: WASO (Wake after sleep onset) was 16.5 minutes with mild sleep fragmentation noted. There were 23 minutes in Stage N1, 220 minutes Stage N2, 45 minutes Stage N3 and 89 minutes in Stage REM.  The percentage of Stage N1 was 6.1%, Stage N2 was 58.4%, which is mildly increased, Stage N3 was 11.9% and Stage R (REM sleep) was 23.6%, which is normal.  RESPIRATORY ANALYSIS:  There were a total of 314 respiratory events:  0 obstructive apneas, 0 central apneas and 0 mixed  apneas with a total of 0 apneas and an apnea index (AI) of 0 /hour. There were 314 hypopneas with a hypopnea index of 50. /hour. The patient also had 0 respiratory event related arousals (RERAs).      The total APNEA/HYPOPNEA INDEX (AHI) was 50. /hour and the total RESPIRATORY DISTURBANCE INDEX was 0. 50. /hour.  37 events occurred in REM sleep and 554 events in NREM. The REM AHI was 24.9 /hour, versus a non-REM AHI of 57.7. The patient spent 156.5 minutes of total sleep time in the supine position and 221 minutes in non-supine.. The supine AHI was 91.2 versus a non-supine AHI of 20.7.  OXYGEN SATURATION & C02:  The Wake baseline 02 saturation was 93%, with the lowest being 84%. Time spent below 89% saturation equaled 15 minutes.  PERIODIC LIMB MOVEMENTS: The patient had a total of 103 Periodic Limb Movements.  The Periodic Limb Movement (PLM) index was 16.4 and the PLM Arousal index was 0/hour. The arousals were noted as: 27 were spontaneous, 0 were associated with PLMs, 177 were associated with respiratory events.  Audio and video analysis did not show any abnormal or unusual movements, behaviors, phonations or vocalizations. The patient took no bathroom breaks. Mild to moderate snoring was noted. The EKG was in keeping with normal sinus rhythm (NSR).   Post-study, the patient indicated that sleep was worse than usual.   IMPRESSION:  1. Obstructive Sleep Apnea (OSA) 2. Periodic Limb Movement Disorder (  PLMD)  RECOMMENDATIONS:  1. This study demonstrates severe obstructive sleep apnea, with a total AHI of 50/hour, REM AHI of 24.9/hour, supine AHI of 91.2/hour and O2 nadir of 84%. Treatment with positive airway pressure in the form of CPAP is recommended. This will require a full night titration study to optimize therapy. Other treatment options may include - generally speaking - avoidance of supine sleep position along with weight loss, upper airway or jaw surgery in selected patients or the use  of an oral appliance in certain patients. ENT evaluation and/or consultation with a maxillofacial surgeon or dentist may be feasible in some instances.    2. Please note that untreated obstructive sleep apnea may carry additional perioperative morbidity. Patients with significant obstructive sleep apnea should receive perioperative PAP therapy and the surgeons and particularly the anesthesiologist should be informed of the diagnosis and the severity of the sleep disordered breathing. 3. Mild PLMs (periodic limb movements of sleep) were noted during this study with no significant arousals; clinical correlation is recommended. PLMs may improve with OSA treatment.  4. The patient should be cautioned not to drive, work at heights, or operate dangerous or heavy equipment when tired or sleepy. Review and reiteration of good sleep hygiene measures should be pursued with any patient. 5. The patient will be seen in follow-up in the sleep clinic at [facility] for discussion of the test results, symptom and treatment compliance review, further management strategies, etc. The referring provider will be notified of the test results.  I certify that I have reviewed the entire raw data recording prior to the issuance of this report in accordance with the Standards of Accreditation of the American Academy of Sleep Medicine (AASM)   Huston Foley, MD, PhD Diplomat, American Board of Neurology and Sleep Medicine (Neurology and Sleep Medicine)

## 2019-03-14 NOTE — Telephone Encounter (Signed)

## 2019-03-14 NOTE — Addendum Note (Signed)
Addended by: Star Age on: 03/14/2019 08:21 AM   Modules accepted: Orders

## 2019-03-14 NOTE — Telephone Encounter (Signed)
I called pt to discuss his sleep study results. No answer, left a message asking her to call me back. 

## 2019-03-14 NOTE — Telephone Encounter (Signed)
-----   Message from Star Age, MD sent at 03/14/2019  8:21 AM EST ----- Patient referred by Dr. Quay Burow, seen by me on 02/14/19, diagnostic PSG on 03/13/19.   Please call and notify the patient that the recent sleep study confirmed his Dx of OSA. He had tried an oral appl in the past. He has evidence of severe obstructive sleep apnea. I recommend treatment for this in the form of CPAP. This will require a repeat sleep study for proper titration and mask fitting and correct monitoring of the oxygen saturations. Please explain to patient. I have placed an order in the chart. Thanks.  Star Age, MD, PhD [facility])

## 2019-03-14 NOTE — Progress Notes (Signed)
Patient referred by Dr. Quay Burow, seen by me on 02/14/19, diagnostic PSG on 03/13/19.   Please call and notify the patient that the recent sleep study confirmed his Dx of OSA. He had tried an oral appl in the past. He has evidence of severe obstructive sleep apnea. I recommend treatment for this in the form of CPAP. This will require a repeat sleep study for proper titration and mask fitting and correct monitoring of the oxygen saturations. Please explain to patient. I have placed an order in the chart. Thanks.  Star Age, MD, PhD [facility])

## 2019-04-01 ENCOUNTER — Other Ambulatory Visit (HOSPITAL_COMMUNITY)
Admission: RE | Admit: 2019-04-01 | Discharge: 2019-04-01 | Disposition: A | Discharge status: Home / Self Care | Payer: BC Managed Care – PPO | Source: Ambulatory Visit | Attending: Neurology | Admitting: Neurology

## 2019-04-01 DIAGNOSIS — Z20828 Contact with and (suspected) exposure to other viral communicable diseases: Secondary | ICD-10-CM | POA: Insufficient documentation

## 2019-04-01 DIAGNOSIS — Z01812 Encounter for preprocedural laboratory examination: Secondary | ICD-10-CM | POA: Insufficient documentation

## 2019-04-02 LAB — NOVEL CORONAVIRUS, NAA (HOSP ORDER, SEND-OUT TO REF LAB; TAT 18-24 HRS): SARS-CoV-2, NAA: NOT DETECTED

## 2019-04-04 ENCOUNTER — Ambulatory Visit (INDEPENDENT_AMBULATORY_CARE_PROVIDER_SITE_OTHER): Payer: BC Managed Care – PPO | Admitting: Neurology

## 2019-04-04 ENCOUNTER — Other Ambulatory Visit: Payer: Self-pay

## 2019-04-04 DIAGNOSIS — G4733 Obstructive sleep apnea (adult) (pediatric): Secondary | ICD-10-CM

## 2019-04-04 DIAGNOSIS — G4720 Circadian rhythm sleep disorder, unspecified type: Secondary | ICD-10-CM

## 2019-04-04 DIAGNOSIS — G4761 Periodic limb movement disorder: Secondary | ICD-10-CM

## 2019-04-11 ENCOUNTER — Telehealth: Payer: Self-pay

## 2019-04-11 NOTE — Addendum Note (Signed)
Addended by: Star Age on: 04/11/2019 11:58 AM   Modules accepted: Orders

## 2019-04-11 NOTE — Telephone Encounter (Signed)
I called pt to discuss his sleep study results. No answer, left a message asking him to call me back. 

## 2019-04-11 NOTE — Progress Notes (Signed)
Patient referred by Dr. Quay Burow, seen by me on 02/14/19, diagnostic PSG on 03/13/19.  Patient had a CPAP titration study on 04/04/19.  Please call and inform patient that I have entered an order for treatment with positive airway pressure (PAP) treatment for obstructive sleep apnea (OSA). He did well during the latest sleep study with BiPAP. We will, therefore, arrange for a machine for home use through a DME (durable medical equipment) company of His choice; and I will see the patient back in follow-up in about 10 weeks. Please also explain to the patient that I will be looking out for compliance data, which can be downloaded from the machine (stored on an SD card, that is inserted in the machine) or via remote access through a modem, that is built into the machine. At the time of the followup appointment we will discuss sleep study results and how it is going with PAP treatment at home. Please advise patient to bring His machine at the time of the first FU visit, even though this is cumbersome. Bringing the machine for every visit after that will likely not be needed, but often helps for the first visit to troubleshoot if needed. Please re-enforce the importance of compliance with treatment and the need for [place] to monitor compliance data - often an insurance requirement and actually good feedback for the patient as far as how they are doing.  Also remind patient, that any interim PAP machine or mask issues should be first addressed with the DME company, as they can often help better with technical and mask fit issues. Please ask if patient has a preference regarding DME company.  Please also make sure, the patient has a follow-up appointment with me in about 10 weeks from the setup date, thanks. May see one of our nurse practitioners if needed for proper timing of the FU appointment.  Please fax or rout report to the referring provider. Thanks,   Star Age, MD, PhD [facility])

## 2019-04-11 NOTE — Telephone Encounter (Signed)
I called pt. I advised pt that Dr. Rexene Alberts reviewed their sleep study results and found that pt did well with a bipap during his latest sleep study. Dr. Rexene Alberts recommends that pt start a bipap at home. I reviewed PAP compliance expectations with the pt. Pt is agreeable to starting a BiPAP. I advised pt that an order will be sent to a DME, [facility], and [facility] will call the pt within about one week after they file with the pt's insurance. [facility] will show the pt how to use the machine, fit for masks, and troubleshoot the BiPAP if needed. A follow up appt was made for insurance purposes with Amy, NP on 07/02/19 at 11:30am. Pt verbalized understanding to arrive 15 minutes early and bring their BiPAP. A letter with all of this information in it will be mailed to the pt as a reminder. I verified with the pt that the address we have on file is correct. Pt verbalized understanding of results. Pt had no questions at this time but was encouraged to call back if questions arise. I have sent the order to [facility] and have received confirmation that they have received the order.

## 2019-04-11 NOTE — Telephone Encounter (Signed)
-----   Message from Star Age, MD sent at 04/11/2019 11:58 AM EST ----- Patient referred by Dr. Quay Burow, seen by me on 02/14/19, diagnostic PSG on 03/13/19.  Patient had a CPAP titration study on 04/04/19.  Please call and inform patient that I have entered an order for treatment with positive airway pressure (PAP) treatment for obstructive sleep apnea (OSA). He did well during the latest sleep study with BiPAP. We will, therefore, arrange for a machine for home use through a DME (durable medical equipment) company of His choice; and I will see the patient back in follow-up in about 10 weeks. Please also explain to the patient that I will be looking out for compliance data, which can be downloaded from the machine (stored on an SD card, that is inserted in the machine) or via remote access through a modem, that is built into the machine. At the time of the followup appointment we will discuss sleep study results and how it is going with PAP treatment at home. Please advise patient to bring His machine at the time of the first FU visit, even though this is cumbersome. Bringing the machine for every visit after that will likely not be needed, but often helps for the first visit to troubleshoot if needed. Please re-enforce the importance of compliance with treatment and the need for [place] to monitor compliance data - often an insurance requirement and actually good feedback for the patient as far as how they are doing.  Also remind patient, that any interim PAP machine or mask issues should be first addressed with the DME company, as they can often help better with technical and mask fit issues. Please ask if patient has a preference regarding DME company.  Please also make sure, the patient has a follow-up appointment with me in about 10 weeks from the setup date, thanks. May see one of our nurse practitioners if needed for proper timing of the FU appointment.  Please fax or rout report to the referring provider.  Thanks,   Star Age, MD, PhD [facility])

## 2019-04-11 NOTE — Telephone Encounter (Signed)
Pt returning call please call back °

## 2019-04-11 NOTE — Procedures (Signed)
S PATIENT'S NAME:  Jack Wood, Jack Wood DOB:      November 18, 1955      MR#:    [id number]     DATE OF RECORDING: 04/04/2019 REFERRING M.D.:  Cheryll Cockayne MD Study Performed:   CPAP  Titration HISTORY: 63 year old man with a history of diabetes, rosacea, diverticulosis, and overweight state, who presents for a full night titration study. His baseline sleep study from 03/13/19 showed severe obstructive sleep apnea, with a total AHI of 50/hour, REM AHI of 24.9/hour, supine AHI of 91.2/hour and O2 nadir of 84%. The patient endorsed the Epworth Sleepiness Scale at 7 points. BMI of 28.4 kg/m2. Neck circumference measured 16.5 inches.  CURRENT MEDICATIONS: Sulfaclease face wash   PROCEDURE:  This is a multichannel digital polysomnogram utilizing the SomnoStar 11.2 system.  Electrodes and sensors were applied and monitored per AASM Specifications.   EEG, EOG, Chin and Limb EMG, were sampled at 200 Hz.  ECG, Snore and Nasal Pressure, Thermal Airflow, Respiratory Effort, CPAP Flow and Pressure, Oximetry was sampled at 50 Hz. Digital video and audio were recorded.      The patient was fitted with a medium F20 FFM. CPAP was initiated at 5 cmH20 with heated humidity per AASM standards and pressure was advanced to 16 cmH20 because of hypopneas, apneas and desaturations. Due to the higher pressure requirement, he was switched to BiPAP of 16/12 cm and further titrated to a pressure of 20/15 cmH20, at which point his AHI was 0.7/hour, O2 nadir of 89% and non-supine REM sleep achieved.   Lights Out was at 21:18 and Lights On at 04:47. Total recording time (TRT) was 449.5 minutes, with a total sleep time (TST) of 368 minutes. The patient's sleep latency was 19.5 minutes. REM latency was 112.5 minutes.  The sleep efficiency was 81.9 %.    SLEEP ARCHITECTURE: WASO (Wake after sleep onset) was 62 minutes with mild to moderate sleep fragmentation noted. There were 26 minutes in Stage N1, 218 minutes Stage N2, 45.5 minutes Stage N3  and 78.5 minutes in Stage REM.  The percentage of Stage N1 was 7.1%, Stage N2 was 59.2%, which is increased, Stage N3 was 12.4% and Stage R (REM sleep) was 21.3%, which is normal. The arousals were noted as: 47 were spontaneous, 0 were associated with PLMs, 50 were associated with respiratory events.  RESPIRATORY ANALYSIS:  There was a total of 91 respiratory events: 0 obstructive apneas, 7 central apneas and 2 mixed apneas with a total of 9 apneas and an apnea index (AI) of 1.5 /hour. There were 82 hypopneas with a hypopnea index of 13.4/hour. The patient also had 0 respiratory event related arousals (RERAs).      The total APNEA/HYPOPNEA INDEX  (AHI) was 14.8 /hour and the total RESPIRATORY DISTURBANCE INDEX was 14.8 /hour  10 events occurred in REM sleep and 81 events in NREM. The REM AHI was 7.6 /hour versus a non-REM AHI of 16.8 /hour.  The patient spent 177 minutes of total sleep time in the supine position and 191 minutes in non-supine. The supine AHI was 25.4, versus a non-supine AHI of 5.0.  OXYGEN SATURATION & C02:  The baseline 02 saturation was 94%, with the lowest being 89%. Time spent below 89% saturation equaled 0 minutes.  PERIODIC LIMB MOVEMENTS:  The patient had a total of 0 Periodic Limb Movements. The Periodic Limb Movement (PLM) index was 0 and the PLM Arousal index was 0 /hour.  Audio and video analysis did not show any abnormal  or unusual movements, behaviors, phonations or vocalizations. The patient took no bathroom breaks. The EKG was in keeping with normal sinus rhythm (NSR).  Post-study, the patient indicated that sleep was worse than usual.   IMPRESSION:   1. Obstructive Sleep Apnea (OSA) 2. Dysfunctions associated with sleep stages or arousals from sleep   RECOMMENDATIONS:   1. This study demonstrates resolution of the patient's obstructive sleep apnea with BiPAP therapy. I will, therefore, start the patient on home BiPAP treatment at a pressure of 20/15 cm via  medium F20 FFM with heated humidity. The patient should be reminded to be fully compliant with PAP therapy to improve sleep related symptoms and decrease long term cardiovascular risks. The patient should be reminded, that it may take up to 3 months to get fully used to using PAP with all planned sleep. The earlier full compliance is achieved, the better long term compliance tends to be. Please note that untreated obstructive sleep apnea carries additional perioperative morbidity. Patients with significant obstructive sleep apnea should receive perioperative PAP therapy and the surgeons and particularly the anesthesiologist should be informed of the diagnosis and the severity of the sleep disordered breathing. 2. This study shows sleep fragmentation and abnormal sleep stage percentages; these are nonspecific findings and per se do not signify an intrinsic sleep disorder or a cause for the patient's sleep-related symptoms. Causes include (but are not limited to) the first night effect of the sleep study, circadian rhythm disturbances, medication effect or an underlying mood disorder or medical problem.  3. The patient should be cautioned not to drive, work at heights, or operate dangerous or heavy equipment when tired or sleepy. Review and reiteration of good sleep hygiene measures should be pursued with any patient. 4. The patient will be seen in follow-up in the sleep clinic at [facility] for discussion of the test results, symptom and treatment compliance review, further management strategies, etc. The referring provider will be notified of the test results.   I certify that I have reviewed the entire raw data recording prior to the issuance of this report in accordance with the Standards of Accreditation of the American Academy of Sleep Medicine (AASM)     Star Age, MD, PhD Diplomat, American Board of Neurology and Sleep Medicine (Neurology and Sleep Medicine)

## 2019-07-02 ENCOUNTER — Encounter: Payer: Self-pay | Admitting: Family Medicine

## 2019-07-02 ENCOUNTER — Ambulatory Visit: Payer: BC Managed Care – PPO | Admitting: Family Medicine

## 2019-07-02 ENCOUNTER — Other Ambulatory Visit: Payer: Self-pay

## 2019-07-02 VITALS — BP 130/80 | HR 73 | Temp 97.5°F | Ht 69.0 in | Wt 189.6 lb

## 2019-07-02 DIAGNOSIS — G4733 Obstructive sleep apnea (adult) (pediatric): Secondary | ICD-10-CM | POA: Diagnosis not present

## 2019-07-02 NOTE — Patient Instructions (Addendum)
Please continue using your BiPAP regularly. While your insurance requires that you use BiPAP at least 4 hours each night on 70% of the nights, I recommend, that you not skip any nights and use it throughout the night if you can. Getting used to BiPAP and staying with the treatment long term does take time and patience and discipline. Untreated obstructive sleep apnea when it is moderate to severe can have an adverse impact on cardiovascular health and raise her risk for heart disease, arrhythmias, hypertension, congestive heart failure, stroke and diabetes. Untreated obstructive sleep apnea causes sleep disruption, nonrestorative sleep, and sleep deprivation. This can have an impact on your day to day functioning and cause daytime sleepiness and impairment of cognitive function, memory loss, mood disturbance, and problems focussing. Using BiPAP regularly can improve these symptoms.  We will adjust pressure settings and follow up in about 6-8 weeks.    Sleep Apnea Sleep apnea affects breathing during sleep. It causes breathing to stop for a short time or to become shallow. It can also increase the risk of:  Heart attack.  Stroke.  Being very overweight (obese).  Diabetes.  Heart failure.  Irregular heartbeat. The goal of treatment is to help you breathe normally again. What are the causes? There are three kinds of sleep apnea:  Obstructive sleep apnea. This is caused by a blocked or collapsed airway.  Central sleep apnea. This happens when the brain does not send the right signals to the muscles that control breathing.  Mixed sleep apnea. This is a combination of obstructive and central sleep apnea. The most common cause of this condition is a collapsed or blocked airway. This can happen if:  Your throat muscles are too relaxed.  Your tongue and tonsils are too large.  You are overweight.  Your airway is too small. What increases the risk?  Being  overweight.  Smoking.  Having a small airway.  Being older.  Being male.  Drinking alcohol.  Taking medicines to calm yourself (sedatives or tranquilizers).  Having family members with the condition. What are the signs or symptoms?  Trouble staying asleep.  Being sleepy or tired during the day.  Getting angry a lot.  Loud snoring.  Headaches in the morning.  Not being able to focus your mind (concentrate).  Forgetting things.  Less interest in sex.  Mood swings.  Personality changes.  Feelings of sadness (depression).  Waking up a lot during the night to pee (urinate).  Dry mouth.  Sore throat. How is this diagnosed?  Your medical history.  A physical exam.  A test that is done when you are sleeping (sleep study). The test is most often done in a sleep lab but may also be done at home. How is this treated?   Sleeping on your side.  Using a medicine to get rid of mucus in your nose (decongestant).  Avoiding the use of alcohol, medicines to help you relax, or certain pain medicines (narcotics).  Losing weight, if needed.  Changing your diet.  Not smoking.  Using a machine to open your airway while you sleep, such as: ? An oral appliance. This is a mouthpiece that shifts your lower jaw forward. ? A CPAP device. This device blows air through a mask when you breathe out (exhale). ? An EPAP device. This has valves that you put in each nostril. ? A BPAP device. This device blows air through a mask when you breathe in (inhale) and breathe out.  Having surgery if  other treatments do not work. It is important to get treatment for sleep apnea. Without treatment, it can lead to:  High blood pressure.  Coronary artery disease.  In men, not being able to have an erection (impotence).  Reduced thinking ability. Follow these instructions at home: Lifestyle  Make changes that your doctor recommends.  Eat a healthy diet.  Lose weight if  needed.  Avoid alcohol, medicines to help you relax, and some pain medicines.  Do not use any products that contain nicotine or tobacco, such as cigarettes, e-cigarettes, and chewing tobacco. If you need help quitting, ask your doctor. General instructions  Take over-the-counter and prescription medicines only as told by your doctor.  If you were given a machine to use while you sleep, use it only as told by your doctor.  If you are having surgery, make sure to tell your doctor you have sleep apnea. You may need to bring your device with you.  Keep all follow-up visits as told by your doctor. This is important. Contact a doctor if:  The machine that you were given to use during sleep bothers you or does not seem to be working.  You do not get better.  You get worse. Get help right away if:  Your chest hurts.  You have trouble breathing in enough air.  You have an uncomfortable feeling in your back, arms, or stomach.  You have trouble talking.  One side of your body feels weak.  A part of your face is hanging down. These symptoms may be an emergency. Do not wait to see if the symptoms will go away. Get medical help right away. Call your local emergency services (911 in the [place] Do not drive yourself to the hospital. Summary  This condition affects breathing during sleep.  The most common cause is a collapsed or blocked airway.  The goal of treatment is to help you breathe normally while you sleep. This information is not intended to replace advice given to you by your health care provider. Make sure you discuss any questions you have with your health care provider. Document Revised: 01/26/2018 Document Reviewed: 12/05/2017 Elsevier Patient Education  2020 [employer].

## 2019-07-02 NOTE — Progress Notes (Addendum)
PATIENT: Jack Wood DOB: 12-28-1955  REASON FOR VISIT: follow up HISTORY FROM: patient  Chief Complaint  Patient presents with  . Follow-up    Initial bipap f/u. Alone. Rm 9. Patient mentioned that he has been having issues with the pressure. He stated that he uses the nasal mask.      HISTORY OF PRESENT ILLNESS: Today 07/02/19 Jack Wood is a 64 y.o. male here today for follow up of OSA recently restarted on CPAP therapy. Sleep study revealed total AHI of 50/hr and supine AHI of 91/hr. O2 nadir 84%. Titration study showed treatment of OSA on BiPAP therapy using a F20 FFM.  He admits that he has had some difficulty with pressure settings.  He feels that when he first places BiPAP therapy he is fairly comfortable and does not note any concerns of air leak.  Within a short period of time, he feels that the pressure becomes too aggressive that there is significant air leaking from his mask and headgear.  He has tried 2 different fullface mask and is now using a nasal pillow.  He feels that the nasal pillow has been the most comfortable.  Compliance report dated 05/24/2019 through 06/22/2019 reveals that he used BiPAP therapy 6 of the last 30 days for compliance of 20%.  He was unable to use BiPAP therapy for greater than 4 hours.  Average usage on days used was 3 hours and 31 minutes.  AHI was 24.3 with IPAP of 20 cm of water and EPAP of 15 cm of water.  There was a large leak noted about 18 minutes on average.   HISTORY: (copied from Dr Guadelupe Sabin note on 02/14/2019)  Dear Dr. Quay Burow, I saw your patient, Valdez Brannan, upon your kind request in my sleep clinic today for initial consultation of his sleep disorder, in particular, evaluation of his prior diagnosis of obstructive sleep apnea.  The patient is unaccompanied today.  As you know, Mr. Jost is a 64 year old right-handed gentleman with an underlying medical history of diabetes, rosacea, diverticulosis, and mildly  overweight state, who was previously diagnosed with obstructive sleep apnea.  Sleep study testing was about 12 years ago.  I reviewed your office note from 02/23/2018 and recent email correspondence with the patient.  He had an oral appliance which was uncomfortable.  He has never been on CPAP therapy.  His Epworth sleepiness score is 7 out of 24, fatigue severity score is 17 out of 63.  He reports that sleep study testing was in .  We do not have prior test results.  He used the oral appliance for about a year to perhaps, he noticed difficulty tolerating it.  He eventually stopped using it.  His snoring has been stable or may be worse.  He has a sister with sleep apnea who was successfully uses a CPAP machine.  He lives with his wife, they have grown children, 1 son, 1 daughter, no grandchildren.  He retired about 2 years ago, worked as Education administrator at [employer].  His bedtime fluctuates now since his retirement.  He stays busy, he is active.  Rise time generally speaking is always early, between 73 and 630, he denies night to night nocturia or morning headaches.  He is a non-smoker and drinks alcohol about 5 days out of the week, caffeine in the form of coffee, 2 cups/day, typically no soda or tea.  They have 2 dogs and 1 cat in the household, typically in the bedroom and  on the bed with them.  He would be willing to get reevaluated for his sleep apnea and consider CPAP therapy.    REVIEW OF SYSTEMS: Out of a complete 14 system review of symptoms, the patient complains only of the following symptoms, none and all other reviewed systems are negative.  Epworth Sleepiness Scale: 6  ALLERGIES: No Known Allergies  HOME MEDICATIONS: Outpatient Medications Prior to Visit  Medication Sig Dispense Refill  . PICATO 0.015 % GEL Take by mouth as needed.    . SULFACLEANSE 8/4 8-4 % SUSP      No facility-administered medications prior to visit.    PAST MEDICAL HISTORY: Past Medical History:  Diagnosis Date    . Acne rosacea   . Diverticula, colon    diverticulitis in 08/2010, 11/2014    PAST SURGICAL HISTORY: Past Surgical History:  Procedure Laterality Date  . APPENDECTOMY  12/2009    FAMILY HISTORY: Family History  Problem Relation Age of Onset  . Osteoporosis Mother   . Valvular heart disease Mother   . Coronary artery disease Father   . Valvular heart disease Father   . Hypertension Father   . Cirrhosis Sister 80       EtOH related    SOCIAL HISTORY: Social History   Socioeconomic History  . Marital status: Married    Spouse name: Not on file  . Number of children: Not on file  . Years of education: Not on file  . Highest education level: Not on file  Occupational History  . Not on file  Tobacco Use  . Smoking status: Never Smoker  . Smokeless tobacco: Never Used  Substance and Sexual Activity  . Alcohol use: Yes    Alcohol/week: 0.0 standard drinks    Comment: social  . Drug use: No  . Sexual activity: Not on file  Other Topics Concern  . 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 Difficulty of Paying Living Expenses: Not on file  Food Insecurity:   . Worried About Charity fundraiser in the Last Year: Not on file  . Ran Out of Food in the Last Year: Not on file  Transportation Needs:   . Lack of Transportation (Medical): Not on file  . Lack of Transportation (Non-Medical): Not on file  Physical Activity:   . Days of Exercise per Week: Not on file  . Minutes of Exercise per Session: Not on file  Stress:   . Feeling of Stress : Not on file  Social Connections:   . Frequency of Communication with Friends and Family: Not on file  . Frequency of Social Gatherings with Friends and Family: Not on file  . Attends Religious Services: Not on file  . Active Member of Clubs or Organizations: Not on file  . Attends Archivist  Meetings: Not on file  . Marital Status: Not on file  Intimate Partner Violence:   . Fear of Current or Ex-Partner: Not on file  . Emotionally Abused: Not on file  . Physically Abused: Not on file  . Sexually Abused: Not on file      PHYSICAL EXAM  Vitals:   07/02/19 1133  BP: 130/80  Pulse: 73  Temp: (!) 97.5 F (36.4 C)  TempSrc: Oral  Weight: 189 lb 9.6 oz (86 kg)  Height: _0  (1.753 m)  Body mass index is 28 kg/m.  Generalized: Well developed, in no acute distress  Cardiology: normal rate and rhythm, no murmur noted Respiratory: Clear to auscultation bilaterally Neurological examination  Mentation: Alert oriented to time, place, history taking. Follows all commands speech and language fluent Cranial nerve II-XII: Pupils were equal round reactive to light. Extraocular movements were full, visual field were full  Motor: The motor testing reveals 5 over 5 strength of all 4 extremities. Good symmetric motor tone is noted throughout.  Gait and station: Gait is normal.   DIAGNOSTIC DATA (LABS, IMAGING, TESTING) - I reviewed patient records, labs, notes, testing and imaging myself where available.  No flowsheet data found.   Lab Results  Component Value Date   WBC 10.2 02/23/2018   HGB 15.5 02/23/2018   HCT 44.6 02/23/2018   MCV 87.1 02/23/2018   PLT 289.0 02/23/2018      Component Value Date/Time   NA 140 02/23/2018 1528   K 4.1 02/23/2018 1528   CL 102 02/23/2018 1528   CO2 28 02/23/2018 1528   GLUCOSE 108 (H) 02/23/2018 1528   BUN 17 02/23/2018 1528   CREATININE 0.95 02/23/2018 1528   CALCIUM 9.9 02/23/2018 1528   PROT 7.9 02/23/2018 1528   ALBUMIN 4.7 02/23/2018 1528   AST 15 02/23/2018 1528   ALT 24 02/23/2018 1528   ALKPHOS 64 02/23/2018 1528   BILITOT 0.5 02/23/2018 1528   GFRNONAA >60 12/27/2009 2259   GFRAA  12/27/2009 2259    >60        The eGFR has been calculated using the MDRD equation. This calculation has not been validated in all  clinical situations. eGFR's persistently <60 mL/min signify possible Chronic Kidney Disease.   Lab Results  Component Value Date   CHOL 208 (H) 02/23/2018   HDL 36.60 (L) 02/23/2018   LDLCALC 162 (H) 04/01/2016   LDLDIRECT 154.0 02/23/2018   TRIG 361.0 (H) 02/23/2018   CHOLHDL 6 02/23/2018   Lab Results  Component Value Date   HGBA1C 6.3 02/23/2018   No results found for: VITAMINB12 Lab Results  Component Value Date   TSH 2.49 02/23/2018       ASSESSMENT AND PLAN 64 y.o. year old male  has a past medical history of Acne rosacea and Diverticula, colon. here with     ICD-10-CM   1. OSA treated with BiPAP  G47.33 For home use only DME Bipap    Mr Salehi has experienced some difficulty adjusting to BiPAP therapy.  He feels that pressure settings may be a little aggressive.  He feels that nasal pillow mask has been the best fit for him but he continues to note a significant air leak.  We will adjust IPAP to 19 cm of water and EPAP to 14 cm of water.  We have discussed common obstacles with BiPAP therapy.  He is encouraged to use BiPAP nightly and aim for a goal of at least 4 hours usage each night.  We will follow-up in 6 weeks to assess response to pressure changes and effects on AHI.  He verbalizes understanding and agreement with this plan.   Orders Placed This Encounter  Procedures  . For home use only DME Bipap    Please adjust pressure setting to IPAP 19cmH20 and EPAP of 14cmH20.    Order Specific Question:   Length of Need    Answer:   Lifetime    Order Specific Question:   Inspiratory pressure    Answer:  OTHER SEE COMMENTS    Order Specific Question:   Expiratory pressure    Answer:   OTHER SEE COMMENTS     No orders of the defined types were placed in this encounter.     I spent 15 minutes with the patient. 50% of this time was spent counseling and educating patient on plan of care and medications.    Debbora Presto, FNP-C 07/02/2019, 12:01 PM [facility] [address], [place], [place] [address] [phone]  I reviewed the above note and documentation by the Nurse Practitioner and agree with the history, exam, assessment and plan as outlined above. I was available for consultation. Star Age, MD, PhD [facility])

## 2019-08-27 ENCOUNTER — Telehealth (INDEPENDENT_AMBULATORY_CARE_PROVIDER_SITE_OTHER): Payer: BC Managed Care – PPO | Admitting: Family Medicine

## 2019-08-27 DIAGNOSIS — G4733 Obstructive sleep apnea (adult) (pediatric): Secondary | ICD-10-CM | POA: Diagnosis not present

## 2019-08-27 NOTE — Progress Notes (Addendum)
PATIENT: Jack Wood DOB: Dec 15, 1955  REASON FOR VISIT: follow up HISTORY FROM: patient  Virtual Visit via Telephone Note  I connected with Jack Wood on 08/28/19 at 10:00 AM EDT by telephone and verified that I am speaking with the correct person using two identifiers.   I discussed the limitations, risks, security and privacy concerns of performing an evaluation and management service by telephone and the availability of in person appointments. I also discussed with the patient that there may be a patient responsible charge related to this service. The patient expressed understanding and agreed to proceed.   History of Present Illness:  08/28/19 Jack Wood is a 64 y.o. male here today for follow up for OSA on BiPAP therapy. He continues to have difficulty with therapy. We decreased pressure to 19/14 in March after reports of therapy being uncomfortable. He does not feel that pressure change was helpful at all. He continues to feel that pressure is too strong and can not keep his mask on. He has tried two FFM and nasal pillow. He feels nasal pillow is the best option for him. He continues to note a large leak. Titration study on 04/04/2019 showed "CPAP was initiated at 5 cmH20 with heated humidity per AASM standards and pressure was advanced to 16 cmH20 because of hypopneas, apneas and  desaturations. Due to the higher pressure requirement, he was switched to BiPAP of 16/12 cm and further titrated to a pressure of 20/15 cmH20, at which point his AHI was 0.7/hour, O2 nadir of 89% and non-supine REM sleep achieved".  Compliance report dated 06/24/2019 through 07/23/2019 reveals that he used BiPAP therapy 5 of the 30 days.  Compliance of 16.7%.  He was unable to use BiPAP for greater than 4 hours.  Residual AHI 23.2 with IPAP of 19 cm of water and EPAP of 14 cm of water.  There was a large leak noted 22 minutes on average.  History (copied from my note on 07/02/2019)  Jack Wood is a 64 y.o. male here today for follow up of OSA recently restarted on CPAP therapy. Sleep study revealed total AHI of 50/hr and supine AHI of 91/hr. O2 nadir 84%. Titration study showed treatment of OSA on BiPAP therapy using a F20 FFM.  He admits that he has had some difficulty with pressure settings.  He feels that when he first places BiPAP therapy he is fairly comfortable and does not note any concerns of air leak.  Within a short period of time, he feels that the pressure becomes too aggressive that there is significant air leaking from his mask and headgear.  He has tried 2 different fullface mask and is now using a nasal pillow.  He feels that the nasal pillow has been the most comfortable.  Compliance report dated 05/24/2019 through 06/22/2019 reveals that he used BiPAP therapy 6 of the last 30 days for compliance of 20%.  He was unable to use BiPAP therapy for greater than 4 hours.  Average usage on days used was 3 hours and 31 minutes.  AHI was 24.3 with IPAP of 20 cm of water and EPAP of 15 cm of water.  There was a large leak noted about 18 minutes on average.   HISTORY: (copied from Dr Guadelupe Sabin note on 02/14/2019)  Dear Dr. Quay Burow, I saw your patient, Jack Wood, upon your kind request in my sleep clinic today for initial consultation of his sleep disorder, in particular, evaluation of his prior  diagnosis of obstructive sleep apnea. The patient is unaccompanied today. As you know, Jack Wood is a 64 year old right-handed gentleman with an underlying medical history of diabetes, rosacea, diverticulosis, and mildly overweight state, who was previously diagnosed with obstructive sleep apnea. Sleep study testing was about 12 years ago. I reviewed your office note from 02/23/2018 and recent email correspondence with the patient. He had an oral appliance which was uncomfortable. He has never been on CPAP therapy. His Epworth sleepiness score is 7 out of 24, fatigue severity score  is 17 out of 63. He reports that sleep study testing was in [place]. We do not have prior test results. He used the oral appliance for about a year to perhaps, he noticed difficulty tolerating it. He eventually stopped using it. His snoring has been stable or may be worse. He has a sister with sleep apnea who was successfully uses a CPAP machine. He lives with his wife, they have grown children, 1 son, 1 daughter, no grandchildren. He retired about 2 years ago, worked as Estate manager/land agent at [employer]. His bedtime fluctuates now since his retirement. He stays busy, he is active. Rise time generally speaking is always early, between 6 and 630, he denies night to night nocturia or morning headaches. He is a non-smoker and drinks alcohol about 5 days out of the week, caffeine in the form of coffee, 2 cups/day, typically no soda or tea. They have 2 dogs and 1 cat in the household, typically in the bedroom and on the bed with them. He would be willing to get reevaluated for his sleep apnea and consider CPAP therapy.   Observations/Objective:  Generalized: Well developed, in no acute distress  Mentation: Alert oriented to time, place, history taking. Follows all commands speech and language fluent   Assessment and Plan:  64 y.o. year old male  has a past medical history of Acne rosacea and Diverticula, colon. here with    ICD-10-CM   1. OSA treated with BiPAP  G47.33 For home use only DME Bipap   Jack Wood continues to have difficulty with BiPAP compliance.  He feels that pressure settings are too high and therapy is too uncomfortable.  Compliance report reveals he has used CPAP 5 days over the past 30.  He was unable to use therapy for greater than 4 hours.  There is a large leak noted 22 minutes of time used.  He is requesting that pressure settings be adjusted to the lowest setting possible.  I have reviewed the titration study report and discussed results with patient.  BiPAP settings were initiated  at 16/12 and titrated up to 20/15 due to elevated AHI and hypoxic events.  He has not noted any significant benefit with reducing pressure to 19/14.  We will decrease pressure setting to 16/12.  He was encouraged to continue using BiPAP every night.  He will shoot for 4-hour goal each night.  We will repeat download in 4 to 6 weeks for evaluation.  He will follow-up with me pending review of the next download report.  He verbalizes understanding and agreement with this plan.   Orders Placed This Encounter  Procedures  . For home use only DME Bipap    Please adjust pressure settings to IPAP of 16cmH20 and EPAP of 12cmH20    Order Specific Question:   Length of Need    Answer:   Lifetime    Order Specific Question:   Inspiratory pressure    Answer:   OTHER SEE COMMENTS  Order Specific Question:   Expiratory pressure    Answer:   OTHER SEE COMMENTS    No orders of the defined types were placed in this encounter.    Follow Up Instructions:  I discussed the assessment and treatment plan with the patient. The patient was provided an opportunity to ask questions and all were answered. The patient agreed with the plan and demonstrated an understanding of the instructions.   The patient was advised to call back or seek an in-person evaluation if the symptoms worsen or if the condition fails to improve as anticipated.  I provided 20 minutes of non-face-to-face time during this encounter.  Patient is located at his place of residence during my chart visit.  Provider is located in the office.   Shawnie Dapper, NP    I reviewed the above note and documentation by the Nurse Practitioner and agree with the history, exam, assessment and plan as outlined above. I was available for consultation. Huston Foley, MD, PhD [facility])

## 2019-08-28 ENCOUNTER — Encounter: Payer: Self-pay | Admitting: Family Medicine

## 2019-08-28 NOTE — Progress Notes (Signed)
Community message sent to [facility]. 

## 2019-10-15 ENCOUNTER — Telehealth: Payer: Self-pay | Admitting: Family Medicine

## 2019-10-15 NOTE — Telephone Encounter (Signed)
Compliance report dated 09/10/2019 3 10/09/2019 reviewed.  Patient has used device 10 of the last 30 days.  He has not reached 4-hour usage any of these days.  Residual AHI was 12.3 with IPAP of 16 cm of water and EPAP of 12 cm of water.

## 2019-10-16 NOTE — Telephone Encounter (Signed)
I called pt and he stated after I gave him cpap DL results per AL/NP on most recent usage, that he was better with decreased pressures, and will continue to use cpap periodically.  He owns his machine.  I told him I would relay back to AL/NP.  He verbalized understanding.

## 2019-11-04 ENCOUNTER — Telehealth: Payer: Self-pay

## 2019-11-04 NOTE — Telephone Encounter (Signed)
New message    The patient is asking for an imaging referral on his right arm & back   Reason : pain   Facility : MD recommendation

## 2019-11-05 NOTE — Telephone Encounter (Signed)
F/u   Left voicemail per MD recommendation of an office visit is needed.

## 2019-11-08 ENCOUNTER — Ambulatory Visit (INDEPENDENT_AMBULATORY_CARE_PROVIDER_SITE_OTHER): Payer: BC Managed Care – PPO | Admitting: Family

## 2019-11-08 ENCOUNTER — Other Ambulatory Visit: Payer: Self-pay

## 2019-11-08 ENCOUNTER — Ambulatory Visit (INDEPENDENT_AMBULATORY_CARE_PROVIDER_SITE_OTHER): Payer: BC Managed Care – PPO

## 2019-11-08 VITALS — BP 112/78 | HR 71 | Temp 98.1°F | Ht 69.0 in | Wt 193.6 lb

## 2019-11-08 DIAGNOSIS — M542 Cervicalgia: Secondary | ICD-10-CM | POA: Diagnosis not present

## 2019-11-08 MED ORDER — PREDNISONE 20 MG PO TABS: 40.0000 mg | ORAL_TABLET | Freq: Every day | ORAL | 0 refills | Status: DC

## 2019-11-08 NOTE — Progress Notes (Signed)
Jack Wood is a 64 y.o. male with the following history as recorded in EpicCare:  Patient Active Problem List   Diagnosis Date Noted  . OSA (obstructive sleep apnea), severe 03/14/2019  . Cough 06/15/2018  . Hyperlipidemia 02/27/2018  . Right wrist pain 02/24/2018  . Acute pain of left shoulder 02/24/2018  . Diabetes (HCC) 04/04/2016  . Acne rosacea     Current Outpatient Medications  Medication Sig Dispense Refill  . PICATO 0.015 % GEL Take by mouth as needed.    . predniSONE (DELTASONE) 20 MG tablet Take 2 tablets (40 mg total) by mouth daily with breakfast. 10 tablet 0   No current facility-administered medications for this visit.    Allergies: Patient has no known allergies.  Past Medical History:  Diagnosis Date  . Acne rosacea   . Diverticula, colon    diverticulitis in 08/2010, 11/2014    Past Surgical History:  Procedure Laterality Date  . APPENDECTOMY  12/2009    Family History  Problem Relation Age of Onset  . Osteoporosis Mother   . Valvular heart disease Mother   . Coronary artery disease Father   . Valvular heart disease Father   . Hypertension Father   . Cirrhosis Sister 38       EtOH related    Social History   Tobacco Use  . Smoking status: Never Smoker  . Smokeless tobacco: Never Used  Substance Use Topics  . Alcohol use: Yes    Alcohol/week: 0.0 standard drinks    Comment: social    Subjective:  Patient complaining of neck pain/ right arm pain (radiating) x 1 month; seemed to start after massage; no improvement; feels numbness/ tingling radiating down into right hand;   Overdue for CPE- plans to get scheduled for yearly CPE;     Objective:  Vitals:   11/08/19 1018  BP: 112/78  Pulse: 71  Temp: 98.1 F (36.7 C)  TempSrc: Oral  SpO2: 95%  Weight: 193 lb 9.6 oz (87.8 kg)  Height: 5\' 9"  (1.753 m)    General: Well developed, well nourished, in no acute distress  Skin : Warm and dry.  Head: Normocephalic and atraumatic  Lungs:  Respirations unlabored; clear to auscultation bilaterally without wheeze, rales, rhonchi  Musculoskeletal: No deformities; no active joint inflammation  Extremities: No edema, cyanosis, clubbing  Vessels: Symmetric bilaterally  Neurologic: Alert and oriented; speech intact; face symmetrical; moves all extremities well; CNII-XII intact without focal deficit   Assessment:  1. Cervical pain     Plan:  Suspect pinched nerve; will update cervical X-ray today- may need to consider MRI; trial of Prednisone 40 mg qd x 5 days;  Encouraged to schedule for yearly CPE;  This visit occurred during the SARS-CoV-2 public health emergency.  Safety protocols were in place, including screening questions prior to the visit, additional usage of staff PPE, and extensive cleaning of exam room while observing appropriate contact time as indicated for disinfecting solutions.     No follow-ups on file.  Orders Placed This Encounter  Procedures  . DG Cervical Spine Complete    Standing Status:   Future    Number of Occurrences:   1    Standing Expiration Date:   11/07/2020    Order Specific Question:   Reason for Exam (SYMPTOM  OR DIAGNOSIS REQUIRED)    Answer:   neck pain with radiating symptoms    Order Specific Question:   Preferred imaging location?    Answer:  [facility]    Order Specific Question:   Radiology Contrast Protocol - do NOT remove file path    Answer:   [email or web address]    Requested Prescriptions   Signed Prescriptions Disp Refills  . predniSONE (DELTASONE) 20 MG tablet 10 tablet 0    Sig: Take 2 tablets (40 mg total) by mouth daily with breakfast.

## 2019-11-13 ENCOUNTER — Encounter: Payer: Self-pay | Admitting: Family

## 2019-11-15 ENCOUNTER — Other Ambulatory Visit: Payer: Self-pay | Admitting: Family

## 2019-11-15 DIAGNOSIS — R937 Abnormal findings on diagnostic imaging of other parts of musculoskeletal system: Secondary | ICD-10-CM

## 2019-11-15 DIAGNOSIS — M5412 Radiculopathy, cervical region: Secondary | ICD-10-CM

## 2019-11-24 ENCOUNTER — Other Ambulatory Visit: Payer: Self-pay

## 2019-11-24 ENCOUNTER — Ambulatory Visit
Admission: RE | Admit: 2019-11-24 | Discharge: 2019-11-24 | Disposition: A | Discharge status: Home / Self Care | Payer: BC Managed Care – PPO | Source: Ambulatory Visit | Attending: Family | Admitting: Family

## 2019-11-24 DIAGNOSIS — R937 Abnormal findings on diagnostic imaging of other parts of musculoskeletal system: Secondary | ICD-10-CM

## 2019-11-24 DIAGNOSIS — M5412 Radiculopathy, cervical region: Secondary | ICD-10-CM

## 2019-11-25 ENCOUNTER — Telehealth: Payer: Self-pay

## 2019-11-25 DIAGNOSIS — R9389 Abnormal findings on diagnostic imaging of other specified body structures: Secondary | ICD-10-CM

## 2019-11-25 DIAGNOSIS — J359 Chronic disease of tonsils and adenoids, unspecified: Secondary | ICD-10-CM

## 2019-11-25 NOTE — Telephone Encounter (Signed)
Please call patient regarding neck pain and MRI.   He does have several areas in his spine that may be causing pain.  If his pain does not improve he should let us know - we can refer him to a specialist.   An incidental finding on the mri showed a possible lesion near his tonsil.  It is hard to say what this is but needs to be evaluated further.  I would like to have him see an ENT and get a CT scan to specifically look at this area if he agrees.

## 2019-11-25 NOTE — Telephone Encounter (Signed)
I called and left message for patient today and also sent him a my-chart message as well.

## 2019-11-25 NOTE — Telephone Encounter (Signed)
My-chart and patient agreeable with plan. Message sent to Dr. Lawerance Bach to proceed with ENT referral and CT.

## 2019-11-28 ENCOUNTER — Telehealth: Payer: Self-pay | Admitting: *Deleted

## 2019-11-28 DIAGNOSIS — E119 Type 2 diabetes mellitus without complications: Secondary | ICD-10-CM

## 2019-11-28 NOTE — Telephone Encounter (Signed)
Pt is needing to have BMET done prior to the CT. Appt has been made for 12/05/19. Need to know what dx code to use for labs.Marland KitchenRaechel Chute

## 2019-11-28 NOTE — Telephone Encounter (Signed)
Test ordered

## 2019-11-28 NOTE — Addendum Note (Signed)
Addended by: Pincus Sanes on: 11/28/2019 11:41 AM   Modules accepted: Orders

## 2019-11-29 ENCOUNTER — Other Ambulatory Visit (INDEPENDENT_AMBULATORY_CARE_PROVIDER_SITE_OTHER): Payer: BC Managed Care – PPO

## 2019-11-29 DIAGNOSIS — E119 Type 2 diabetes mellitus without complications: Secondary | ICD-10-CM

## 2019-11-29 LAB — BASIC METABOLIC PANEL
BUN: 16 mg/dL (ref 6–23)
CO2: 27 mEq/L (ref 19–32)
Calcium: 8.9 mg/dL (ref 8.4–10.5)
Chloride: 104 mEq/L (ref 96–112)
Creatinine, Ser: 0.97 mg/dL (ref 0.40–1.50)
GFR: 78.47 mL/min (ref >60.00)
Glucose, Bld: 246 mg/dL — ABNORMAL HIGH (ref 70–99)
Potassium: 3.9 mEq/L (ref 3.5–5.1)
Sodium: 136 mEq/L (ref 135–145)

## 2019-11-30 ENCOUNTER — Other Ambulatory Visit: Payer: Self-pay | Admitting: Family

## 2019-11-30 DIAGNOSIS — R937 Abnormal findings on diagnostic imaging of other parts of musculoskeletal system: Secondary | ICD-10-CM

## 2019-12-05 ENCOUNTER — Ambulatory Visit (INDEPENDENT_AMBULATORY_CARE_PROVIDER_SITE_OTHER)
Admission: RE | Admit: 2019-12-05 | Discharge: 2019-12-05 | Disposition: A | Discharge status: Home / Self Care | Payer: BC Managed Care – PPO | Source: Ambulatory Visit | Attending: Internal Medicine | Admitting: Internal Medicine

## 2019-12-05 ENCOUNTER — Other Ambulatory Visit: Payer: Self-pay

## 2019-12-05 DIAGNOSIS — J359 Chronic disease of tonsils and adenoids, unspecified: Secondary | ICD-10-CM

## 2019-12-05 MED ORDER — IOHEXOL 300 MG/ML  SOLN
75.0000 mL | Freq: Once | INTRAMUSCULAR | Status: AC | PRN
  Administered 2019-12-05: 75 mL via INTRAVENOUS

## 2019-12-09 ENCOUNTER — Other Ambulatory Visit: Payer: BC Managed Care – PPO

## 2020-01-07 ENCOUNTER — Encounter: Payer: Self-pay | Admitting: Internal Medicine

## 2020-01-07 ENCOUNTER — Other Ambulatory Visit: Payer: Self-pay

## 2020-01-07 ENCOUNTER — Ambulatory Visit (INDEPENDENT_AMBULATORY_CARE_PROVIDER_SITE_OTHER): Payer: BC Managed Care – PPO | Admitting: Internal Medicine

## 2020-01-07 VITALS — BP 124/78 | HR 78 | Temp 98.1°F | Ht 69.0 in | Wt 191.0 lb

## 2020-01-07 DIAGNOSIS — M5412 Radiculopathy, cervical region: Secondary | ICD-10-CM | POA: Diagnosis not present

## 2020-01-07 MED ORDER — GABAPENTIN 100 MG PO CAPS: ORAL_CAPSULE | ORAL | 3 refills | Status: DC

## 2020-01-07 NOTE — Assessment & Plan Note (Signed)
Subacute Symptoms c/w cervical radiculopathy  - symptoms waxed and waned but have persisted and are now getting worse Needs EMG - will refer to ortho Gabapentin - will start at low dose and titrate as tolerated

## 2020-01-07 NOTE — Patient Instructions (Signed)
° °  Medications reviewed and updated.  Changes include :   Gabapentin 200 mg at night.  We will increase as tolerated.      A referral was ordered for [facility].        Someone from their office will call you to schedule an appointment.    Please call if there is no improvement in your symptoms.

## 2020-01-07 NOTE — Progress Notes (Signed)
Subjective:    Patient ID: Jack Wood, male    DOB: 1955-12-12, 64 y.o.   MRN: [id number]  HPI The patient is here for an acute visit.   Middle of June - had full body massage.  It was deep tissue massage.  When he got home he was all bruised.  His right arm started tingling and he had shooting pain down the arm.  he would induce the pain with turning his head.  She saw Lc  Came back and was all bruised.  riht arm tingling and shooting pain.   Saw laura one month later.    He had a xray of his c-spine and then an MRI.  He then had a CT scan to better evaluate the region of his right palatine tonsil.   He then saw neurosurgery and at that time had no pain in right arm, just the tingling.  No need for treatment.   Now he has pain in right arm - dull constant pain and tingling.  He has not been sleeping well.  The pain is most of the arm but concentrates into the 4-5th fingers.  Today he noticed pain in his axilla.   He denies weakness.  Medications and allergies reviewed with patient and updated if appropriate.  Patient Active Problem List   Diagnosis Date Noted  . OSA (obstructive sleep apnea), severe 03/14/2019  . Cough 06/15/2018  . Hyperlipidemia 02/27/2018  . Right wrist pain 02/24/2018  . Acute pain of left shoulder 02/24/2018  . Diabetes (HCC) 04/04/2016  . Acne rosacea     Current Outpatient Medications on File Prior to Visit  Medication Sig Dispense Refill  . PICATO 0.015 % GEL Take by mouth as needed.     No current facility-administered medications on file prior to visit.    Past Medical History:  Diagnosis Date  . Acne rosacea   . Diverticula, colon    diverticulitis in 08/2010, 11/2014    Past Surgical History:  Procedure Laterality Date  . APPENDECTOMY  12/2009    Social History   Socioeconomic History  . Marital status: Married    Spouse name: Not on file  . Number of children: Not on file  . Years of education: Not on file  . Highest  education level: Not on file  Occupational History  . Not on file  Tobacco Use  . Smoking status: Never Smoker  . Smokeless tobacco: Never Used  Substance and Sexual Activity  . Alcohol use: Yes    Alcohol/week: 0.0 standard drinks    Comment: social  . Drug use: No  . Sexual activity: Not on file  Other Topics Concern  . 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 Difficulty of Paying Living Expenses: Not on file  Food Insecurity:   . Worried About Programme researcher, broadcasting/film/video in the Last Year: Not on file  . Ran Out of Food in the Last Year: Not on file  Transportation Needs:   . Lack of Transportation (Medical): Not on file  . Lack of Transportation (Non-Medical): Not on file  Physical Activity:   . Days of Exercise per Week: Not on file  . Minutes of Exercise per Session: Not on file  Stress:   . Feeling of Stress : Not on file  Social Connections:   .  Frequency of Communication with Friends and Family: Not on file  . Frequency of Social Gatherings with Friends and Family: Not on file  . Attends Religious Services: Not on file  . Active Member of Clubs or Organizations: Not on file  . Attends Banker Meetings: Not on file  . Marital Status: Not on file    Family History  Problem Relation Age of Onset  . Osteoporosis Mother   . Valvular heart disease Mother   . Coronary artery disease Father   . Valvular heart disease Father   . Hypertension Father   . Cirrhosis Sister 59       EtOH related    Review of Systems  Musculoskeletal: Negative for neck pain.  Neurological: Positive for numbness. Negative for weakness and headaches.       Objective:   Vitals:   01/07/20 1621  BP: 124/78  Pulse: 78  Temp: 98.1 F (36.7 C)  SpO2: 95%   BP Readings from Last 3 Encounters:  01/07/20 124/78  11/08/19 112/78  07/02/19 130/80   Wt  Readings from Last 3 Encounters:  01/07/20 191 lb (86.6 kg)  11/08/19 193 lb 9.6 oz (87.8 kg)  07/02/19 189 lb 9.6 oz (86 kg)   Body mass index is 28.21 kg/m.   Physical Exam Constitutional:      General: He is not in acute distress.    Appearance: Normal appearance. He is not ill-appearing.  Musculoskeletal:     Right lower leg: No edema.     Left lower leg: No edema.     Comments: No palpable mass in R axilla - area is tender.  Increased pain with flexion / extension of elbow.  No c-spine or posterior neck tenderness, neck with normal ROM.  No upper back tenderness.  FROM of right shoulder w/o pain  Skin:    General: Skin is warm and dry.  Neurological:     Mental Status: He is alert.     Sensory: No sensory deficit.     Motor: No weakness.       CT Soft Tissue Neck W Contrast CLINICAL DATA:  64 year old male with MRI cervical spine earlier this month demonstrating a small round T2 and STIR hyperintense area measuring 10-11 mm at the right palatine tonsil.  EXAM: CT NECK WITH CONTRAST  TECHNIQUE: Multidetector CT imaging of the neck was performed using the standard protocol following the bolus administration of intravenous contrast.  CONTRAST:  25mL OMNIPAQUE IOHEXOL 300 MG/ML  SOLN  COMPARISON:  Cervical spine MRI 11/24/2019.  FINDINGS: Pharynx and larynx: The glottis is closed. Laryngeal contours are within normal limits. Hypopharynx within normal limits.  At the oropharynx there is no asymmetric enlargement of either tonsil. There are small postinflammatory dystrophic calcifications on the left. On the right there is a small fairly circumscribed 10-12 mm oval hypodensity (series 3, image 36 and sagittal image 65) corresponding to the MRI finding. No regional inflammation.  Negative adenoids. Parapharyngeal and retropharyngeal spaces remain normal.  Salivary glands: Negative sublingual space. Submandibular glands and parotid glands are within normal  limits.  Thyroid: Negative.  Lymph nodes: Negative.  No cervical lymphadenopathy.  Vascular: Major vascular structures in the neck and at the skull base are patent. There is a partially retropharyngeal course of both carotids. Mild carotid bifurcation atherosclerosis. The right vertebral artery is dominant.  Limited intracranial: Negative.  Visualized orbits: Negative.  Mastoids and visualized paranasal sinuses: Trace bilateral paranasal sinus mucosal thickening. Tympanic cavities and mastoids  are clear.  Skeleton: No acute osseous abnormality identified. Multilevel cervical spine degeneration as reported on the comparison.  Upper chest: Normal visible superior mediastinum. Negative lung apices.  IMPRESSION: Stable small 10-12 mm hypodense, cystic lesion in the right palatine tonsil from the MRI earlier this month. This is nonspecific, but had a benign appearance by MRI and I favor a postinflammatory cyst - such as the remnant of a remote tonsillar abscess. Also there are postinflammatory calcifications in the opposite tonsil.  Otherwise negative CT appearance of the neck; no lymphadenopathy.  A repeat Neck CT with contrast in 6 months is recommended to re-evaluate, and if stable at that time then future follow-up might be unnecessary.  Electronically Signed   By: Odessa Fleming M.D.   On: 12/05/2019 23:36    Reviewed MRI and c-spine Xray     Assessment & Plan:    See Problem List for Assessment and Plan of chronic medical problems.    This visit occurred during the SARS-CoV-2 public health emergency.  Safety protocols were in place, including screening questions prior to the visit, additional usage of staff PPE, and extensive cleaning of exam room while observing appropriate contact time as indicated for disinfecting solutions.

## 2020-01-10 ENCOUNTER — Telehealth: Payer: Self-pay | Admitting: Physical Medicine and Rehabilitation

## 2020-01-10 NOTE — Telephone Encounter (Signed)
Patient called returning a phone call to [facility] to set appt. Please call patient Monday morning 01/13/20 to set appt. Patient phone number is [phone].

## 2020-01-11 MED ORDER — GABAPENTIN 100 MG PO CAPS: ORAL_CAPSULE | ORAL | 3 refills | Status: DC

## 2020-01-11 NOTE — Addendum Note (Signed)
Addended by: Pincus Sanes on: 01/11/2020 03:27 PM   Modules accepted: Orders

## 2020-01-12 MED ORDER — GABAPENTIN 100 MG PO CAPS: ORAL_CAPSULE | ORAL | 3 refills | Status: DC

## 2020-01-12 NOTE — Addendum Note (Signed)
Addended by: Pincus Sanes on: 01/12/2020 11:07 AM   Modules accepted: Orders

## 2020-01-13 ENCOUNTER — Telehealth: Payer: Self-pay | Admitting: Physical Medicine and Rehabilitation

## 2020-01-13 NOTE — Telephone Encounter (Signed)
Documented in referral  

## 2020-01-14 NOTE — Telephone Encounter (Signed)
Pt called and sch for 02/04/20

## 2020-01-26 NOTE — Progress Notes (Signed)
Subjective:    Patient ID: Jack Wood, male    DOB: 04/16/1956, 64 y.o.   MRN: [id number]  HPI He is here for a physical exam.   Right arm is still hurting.  The gabapentin does not help much.  He has constant low level pain and certain movements cause more severe pain.  EMG scheduled for 10/12.  Dr. Alvester Morin did not think the pain was coming from his neck.  He will get pain shooting down his arm with leaning his head back.  At times he feels like something is focused around his elbow.  He is taking ibuprofen 800 mg bid.    He has been eating good for the past 4 days.  He is trying to eat a more low carbohydrate diet and is not drinking any alcohol.  His goal is to work on losing 15 pounds   Medications and allergies reviewed with patient and updated if appropriate.  Patient Active Problem List   Diagnosis Date Noted  . Cervical radiculopathy 01/07/2020  . OSA (obstructive sleep apnea), severe 03/14/2019  . Cough 06/15/2018  . Hyperlipidemia 02/27/2018  . Right wrist pain 02/24/2018  . Acute pain of left shoulder 02/24/2018  . Diabetes (HCC) 04/04/2016  . Acne rosacea     Current Outpatient Medications on File Prior to Visit  Medication Sig Dispense Refill  . gabapentin (NEURONTIN) 100 MG capsule Take 400 mg in morning and 500 mg at bedtime. 270 capsule 3  . PICATO 0.015 % GEL Take by mouth as needed.     No current facility-administered medications on file prior to visit.    Past Medical History:  Diagnosis Date  . Acne rosacea   . Diverticula, colon    diverticulitis in 08/2010, 11/2014    Past Surgical History:  Procedure Laterality Date  . APPENDECTOMY  12/2009    Social History   Socioeconomic History  . Marital status: Married    Spouse name: Not on file  . Number of children: Not on file  . Years of education: Not on file  . Highest education level: Not on file  Occupational History  . Not on file  Tobacco Use  . Smoking status: Never Smoker    . Smokeless tobacco: Never Used  Substance and Sexual Activity  . Alcohol use: Yes    Alcohol/week: 0.0 standard drinks    Comment: social  . Drug use: No  . Sexual activity: Not on file  Other Topics Concern  . 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 Difficulty of Paying Living Expenses: Not on file  Food Insecurity:   . Worried About Programme researcher, broadcasting/film/video in the Last Year: Not on file  . Ran Out of Food in the Last Year: Not on file  Transportation Needs:   . Lack of Transportation (Medical): Not on file  . Lack of Transportation (Non-Medical): Not on file  Physical Activity:   . Days of Exercise per Week: Not on file  . Minutes of Exercise per Session: Not on file  Stress:   . Feeling of Stress : Not on file  Social Connections:   . Frequency of Communication with Friends and Family: Not on file  . Frequency of Social Gatherings with Friends and Family: Not on file  . Attends Religious Services: Not on file  .  Active Member of Clubs or Organizations: Not on file  . Attends Banker Meetings: Not on file  . Marital Status: Not on file    Family History  Problem Relation Age of Onset  . Osteoporosis Mother   . Valvular heart disease Mother   . Coronary artery disease Father   . Valvular heart disease Father   . Hypertension Father   . Cirrhosis Sister 69       EtOH related    Review of Systems  Constitutional: Negative for chills and fever.  Eyes: Negative for visual disturbance.  Respiratory: Negative for cough, shortness of breath and wheezing.   Cardiovascular: Negative for chest pain, palpitations and leg swelling.  Gastrointestinal: Positive for anal bleeding (hemorrhoidal on occ). Negative for abdominal pain, blood in stool, constipation, diarrhea and nausea.       No gerd  Genitourinary: Negative for difficulty urinating,  dysuria and hematuria.  Musculoskeletal: Positive for arthralgias (r wrist). Negative for neck pain and neck stiffness.       Right arm  Skin: Negative for color change and rash.  Neurological: Negative for dizziness, light-headedness and headaches.  Psychiatric/Behavioral: Negative for dysphoric mood. The patient is not nervous/anxious.        Objective:   Vitals:   01/27/20 0858  BP: 118/76  Pulse: 66  Temp: 98 F (36.7 C)  SpO2: 96%   Filed Weights   01/27/20 0858  Weight: 185 lb (83.9 kg)   Body mass index is 27.32 kg/m.  BP Readings from Last 3 Encounters:  01/27/20 118/76  01/07/20 124/78  11/08/19 112/78    Wt Readings from Last 3 Encounters:  01/27/20 185 lb (83.9 kg)  01/07/20 191 lb (86.6 kg)  11/08/19 193 lb 9.6 oz (87.8 kg)    Depression screen [facility] 2/9 11/08/2019 02/23/2018  Decreased Interest 0 0  Down, Depressed, Hopeless 0 0  PHQ - 2 Score 0 0  Altered sleeping - 0  Tired, decreased energy - 0  Change in appetite - 0  Feeling bad or failure about yourself  - 0  Trouble concentrating - 0  Moving slowly or fidgety/restless - 0  Suicidal thoughts - 0  PHQ-9 Score - 0     Physical Exam Constitutional: He appears well-developed and well-nourished. No distress.  HENT:  Head: Normocephalic and atraumatic.  Right Ear: External ear normal.  Left Ear: External ear normal.  Mouth/Throat: Oropharynx is clear and moist.  Normal ear canals and TM b/l  Eyes: Conjunctivae and EOM are normal.  Neck: Neck supple. No tracheal deviation present. No thyromegaly present.  No carotid bruit  Cardiovascular: Normal rate, regular rhythm, normal heart sounds and intact distal pulses.   No murmur heard. Pulmonary/Chest: Effort normal and breath sounds normal. No respiratory distress. He has no wheezes. He has no rales.  Abdominal: Soft. He exhibits no distension. There is no tenderness.  Genitourinary: deferred  Musculoskeletal: He exhibits no edema.    Lymphadenopathy:   He has no cervical adenopathy.  Skin: Skin is warm and dry. He is not diaphoretic.  Psychiatric: He has a normal mood and affect. His behavior is normal.         Assessment & Plan:   Physical exam: Screening blood work  ordered Immunizations  Flu vaccine done previously, deferred tdap, discussed shingrix Colonoscopy   Up to date  - due 08/2020 Eye exams   Due - will schedule Exercise   Very active Weight  Ok for age -  working on weight loss Substance abuse   none  Sees derm annualy   Screened for depression using the PHQ 2 scale.  No evidence of depression.    See Problem List for Assessment and Plan of chronic medical problems.   This visit occurred during the SARS-CoV-2 public health emergency.  Safety protocols were in place, including screening questions prior to the visit, additional usage of staff PPE, and extensive cleaning of exam room while observing appropriate contact time as indicated for disinfecting solutions.

## 2020-01-26 NOTE — Patient Instructions (Addendum)
Blood work was ordered.    All other Health Maintenance issues reviewed.   All recommended immunizations and age-appropriate screenings are up-to-date or discussed.  No immunization administered today.   Medications reviewed and updated.  Changes include :   Ibuprofen 800 mg three times a day as needed  Your prescription(s) have been submitted to your pharmacy. Please take as directed and contact our office if you believe you are having problem(s) with the medication(s).    Please followup in 6 months    Health Maintenance, Male Adopting a healthy lifestyle and getting preventive care are important in promoting health and wellness. Ask your health care provider about:  The right schedule for you to have regular tests and exams.  Things you can do on your own to prevent diseases and keep yourself healthy. What should I know about diet, weight, and exercise? Eat a healthy diet   Eat a diet that includes plenty of vegetables, fruits, low-fat dairy products, and lean protein.  Do not eat a lot of foods that are high in solid fats, added sugars, or sodium. Maintain a healthy weight Body mass index (BMI) is a measurement that can be used to identify possible weight problems. It estimates body fat based on height and weight. Your health care provider can help determine your BMI and help you achieve or maintain a healthy weight. Get regular exercise Get regular exercise. This is one of the most important things you can do for your health. Most adults should:  Exercise for at least 150 minutes each week. The exercise should increase your heart rate and make you sweat (moderate-intensity exercise).  Do strengthening exercises at least twice a week. This is in addition to the moderate-intensity exercise.  Spend less time sitting. Even light physical activity can be beneficial. Watch cholesterol and blood lipids Have your blood tested for lipids and cholesterol at 62 years of age, then  have this test every 5 years. You may need to have your cholesterol levels checked more often if:  Your lipid or cholesterol levels are high.  You are older than 62 years of age.  You are at high risk for heart disease. What should I know about cancer screening? Many types of cancers can be detected early and may often be prevented. Depending on your health history and family history, you may need to have cancer screening at various ages. This may include screening for:  Colorectal cancer.  Prostate cancer.  Skin cancer.  Lung cancer. What should I know about heart disease, diabetes, and high blood pressure? Blood pressure and heart disease  High blood pressure causes heart disease and increases the risk of stroke. This is more likely to develop in people who have high blood pressure readings, are of African descent, or are overweight.  Talk with your health care provider about your target blood pressure readings.  Have your blood pressure checked: ? Every 3-5 years if you are 77-49 years of age. ? Every year if you are 35 years old or older.  If you are between the ages of 53 and 85 and are a current or former smoker, ask your health care provider if you should have a one-time screening for abdominal aortic aneurysm (AAA). Diabetes Have regular diabetes screenings. This checks your fasting blood sugar level. Have the screening done:  Once every three years after age 16 if you are at a normal weight and have a low risk for diabetes.  More often and at a younger  age if you are overweight or have a high risk for diabetes. What should I know about preventing infection? Hepatitis B If you have a higher risk for hepatitis B, you should be screened for this virus. Talk with your health care provider to find out if you are at risk for hepatitis B infection. Hepatitis C Blood testing is recommended for:  Everyone born from [birth year] through 1963.  Anyone with known risk factors for  hepatitis C. Sexually transmitted infections (STIs)  You should be screened each year for STIs, including gonorrhea and chlamydia, if: ? You are sexually active and are younger than 62 years of age. ? You are older than 62 years of age and your health care provider tells you that you are at risk for this type of infection. ? Your sexual activity has changed since you were last screened, and you are at increased risk for chlamydia or gonorrhea. Ask your health care provider if you are at risk.  Ask your health care provider about whether you are at high risk for HIV. Your health care provider may recommend a prescription medicine to help prevent HIV infection. If you choose to take medicine to prevent HIV, you should first get tested for HIV. You should then be tested every 3 months for as long as you are taking the medicine. Follow these instructions at home: Lifestyle  Do not use any products that contain nicotine or tobacco, such as cigarettes, e-cigarettes, and chewing tobacco. If you need help quitting, ask your health care provider.  Do not use street drugs.  Do not share needles.  Ask your health care provider for help if you need support or information about quitting drugs. Alcohol use  Do not drink alcohol if your health care provider tells you not to drink.  If you drink alcohol: ? Limit how much you have to 0-2 drinks a day. ? Be aware of how much alcohol is in your drink. In the [place] one drink equals one 12 oz bottle of beer (355 mL), one 5 oz glass of wine (148 mL), or one 1 oz glass of hard liquor (44 mL). General instructions  Schedule regular health, dental, and eye exams.  Stay current with your vaccines.  Tell your health care provider if: ? You often feel depressed. ? You have ever been abused or do not feel safe at home. Summary  Adopting a healthy lifestyle and getting preventive care are important in promoting health and wellness.  Follow your health care  provider's instructions about healthy diet, exercising, and getting tested or screened for diseases.  Follow your health care provider's instructions on monitoring your cholesterol and blood pressure. This information is not intended to replace advice given to you by your health care provider. Make sure you discuss any questions you have with your health care provider. Document Revised: 04/04/2018 Document Reviewed: 04/04/2018 Elsevier Patient Education  2020 [employer].

## 2020-01-27 ENCOUNTER — Encounter: Payer: Self-pay | Admitting: Internal Medicine

## 2020-01-27 ENCOUNTER — Ambulatory Visit (INDEPENDENT_AMBULATORY_CARE_PROVIDER_SITE_OTHER): Payer: BC Managed Care – PPO | Admitting: Internal Medicine

## 2020-01-27 ENCOUNTER — Other Ambulatory Visit: Payer: Self-pay

## 2020-01-27 VITALS — BP 118/76 | HR 66 | Temp 98.0°F | Ht 69.0 in | Wt 185.0 lb

## 2020-01-27 DIAGNOSIS — E7849 Other hyperlipidemia: Secondary | ICD-10-CM

## 2020-01-27 DIAGNOSIS — L719 Rosacea, unspecified: Secondary | ICD-10-CM

## 2020-01-27 DIAGNOSIS — E119 Type 2 diabetes mellitus without complications: Secondary | ICD-10-CM

## 2020-01-27 DIAGNOSIS — Z0000 Encounter for general adult medical examination without abnormal findings: Secondary | ICD-10-CM

## 2020-01-27 DIAGNOSIS — M5412 Radiculopathy, cervical region: Secondary | ICD-10-CM | POA: Diagnosis not present

## 2020-01-27 LAB — COMPREHENSIVE METABOLIC PANEL
ALT: 36 U/L (ref 0–53)
AST: 21 U/L (ref 0–37)
Albumin: 4.7 g/dL (ref 3.5–5.2)
Alkaline Phosphatase: 52 U/L (ref 39–117)
BUN: 25 mg/dL — ABNORMAL HIGH (ref 6–23)
CO2: 23 mEq/L (ref 19–32)
Calcium: 9.3 mg/dL (ref 8.4–10.5)
Chloride: 104 mEq/L (ref 96–112)
Creatinine, Ser: 1.04 mg/dL (ref 0.40–1.50)
GFR: 72.37 mL/min (ref >60.00)
Glucose, Bld: 118 mg/dL — ABNORMAL HIGH (ref 70–99)
Potassium: 4.4 mEq/L (ref 3.5–5.1)
Sodium: 136 mEq/L (ref 135–145)
Total Bilirubin: 0.7 mg/dL (ref 0.2–1.2)
Total Protein: 8 g/dL (ref 6.0–8.3)

## 2020-01-27 LAB — CBC WITH DIFFERENTIAL/PLATELET
Basophils Absolute: 0.1 10*3/uL (ref 0.0–0.1)
Basophils Relative: 1.3 % (ref 0.0–3.0)
Eosinophils Absolute: 0.5 10*3/uL (ref 0.0–0.7)
Eosinophils Relative: 7.1 % — ABNORMAL HIGH (ref 0.0–5.0)
HCT: 44.2 % (ref 39.0–52.0)
Hemoglobin: 15.2 g/dL (ref 13.0–17.0)
Lymphocytes Relative: 34 % (ref 12.0–46.0)
Lymphs Abs: 2.5 10*3/uL (ref 0.7–4.0)
MCHC: 34.4 g/dL (ref 30.0–36.0)
MCV: 88.1 fl (ref 78.0–100.0)
Monocytes Absolute: 0.8 10*3/uL (ref 0.1–1.0)
Monocytes Relative: 11.2 % (ref 3.0–12.0)
Neutro Abs: 3.4 10*3/uL (ref 1.4–7.7)
Neutrophils Relative %: 46.4 % (ref 43.0–77.0)
Platelets: 267 10*3/uL (ref 150.0–400.0)
RBC: 5.02 Mil/uL (ref 4.22–5.81)
RDW: 13.6 % (ref 11.5–15.5)
WBC: 7.3 10*3/uL (ref 4.0–10.5)

## 2020-01-27 LAB — LIPID PANEL
Cholesterol: 240 mg/dL — ABNORMAL HIGH (ref 0–200)
HDL: 40.7 mg/dL
LDL Cholesterol: 179 mg/dL — ABNORMAL HIGH (ref 0–99)
NonHDL: 199.48
Total CHOL/HDL Ratio: 6
Triglycerides: 102 mg/dL (ref 0.0–149.0)
VLDL: 20.4 mg/dL (ref 0.0–40.0)

## 2020-01-27 LAB — HEMOGLOBIN A1C: Hgb A1c MFr Bld: 6.9 % — ABNORMAL HIGH (ref 4.6–6.5)

## 2020-01-27 LAB — MICROALBUMIN / CREATININE URINE RATIO
Creatinine,U: 83.5 mg/dL
Microalb Creat Ratio: 0.9 mg/g (ref 0.0–30.0)
Microalb, Ur: 0.8 mg/dL (ref 0.0–1.9)

## 2020-01-27 LAB — TSH: TSH: 1.83 u[IU]/mL (ref 0.35–4.50)

## 2020-01-27 MED ORDER — IBUPROFEN 800 MG PO TABS: 800.0000 mg | ORAL_TABLET | Freq: Three times a day (TID) | ORAL | 1 refills | Status: DC | PRN

## 2020-01-27 NOTE — Assessment & Plan Note (Signed)
Chronic Check lipid panel, CMP, TSH Currently diet controlled Continue increased activity and healthy diet Currently working on weight loss

## 2020-01-27 NOTE — Assessment & Plan Note (Signed)
Subacute Pain in right arm seems to be radiculopathy-?  Cervical furcal versus other Has EMG scheduled for 10/12 Following with Dr. Alvester Morin Gabapentin does not seem to be working well so he will taper off slowly Continue ibuprofen 800 mg-we will increase to 3 times daily.  He is aware of potential side effects.  No GI symptoms at this time and will check kidney function today If he needs something stronger for the pain he will let me know

## 2020-01-27 NOTE — Assessment & Plan Note (Signed)
Chronic Has been diet controlled We will check A1c and urine microalbumin today He is currently working on improving his diet and weight loss He is due for an eye exam and will schedule Follow-up in 6 months

## 2020-01-27 NOTE — Assessment & Plan Note (Signed)
Chronic Follows with dermatology annually

## 2020-01-28 LAB — PSA, TOTAL AND FREE
PSA, % Free: 36 % (calc) (ref >25)
PSA, Free: 0.4 ng/mL
PSA, Total: 1.1 ng/mL (ref <=4.0)

## 2020-01-29 ENCOUNTER — Encounter: Payer: Self-pay | Admitting: Internal Medicine

## 2020-02-03 ENCOUNTER — Telehealth: Payer: Self-pay | Admitting: Physical Medicine and Rehabilitation

## 2020-02-03 NOTE — Telephone Encounter (Signed)
Pt called stating he's having a procedure on 02/04/20 and would like to know wether or not he can still take his ibuprofen and gabapentin? Pt would like a CB with answer  [phone]

## 2020-02-03 NOTE — Telephone Encounter (Signed)
Left message to advise. Patient is scheduled for OV/ EMG and may continue all medications.

## 2020-02-04 ENCOUNTER — Other Ambulatory Visit: Payer: Self-pay

## 2020-02-04 ENCOUNTER — Telehealth: Payer: Self-pay | Admitting: Physical Medicine and Rehabilitation

## 2020-02-04 ENCOUNTER — Encounter: Payer: Self-pay | Admitting: Physical Medicine and Rehabilitation

## 2020-02-04 ENCOUNTER — Ambulatory Visit: Payer: BC Managed Care – PPO | Admitting: Physical Medicine and Rehabilitation

## 2020-02-04 DIAGNOSIS — M542 Cervicalgia: Secondary | ICD-10-CM | POA: Diagnosis not present

## 2020-02-04 DIAGNOSIS — M5412 Radiculopathy, cervical region: Secondary | ICD-10-CM | POA: Diagnosis not present

## 2020-02-04 DIAGNOSIS — M5010 Cervical disc disorder with radiculopathy, unspecified cervical region: Secondary | ICD-10-CM | POA: Diagnosis not present

## 2020-02-04 DIAGNOSIS — M4802 Spinal stenosis, cervical region: Secondary | ICD-10-CM

## 2020-02-04 DIAGNOSIS — R202 Paresthesia of skin: Secondary | ICD-10-CM

## 2020-02-04 MED ORDER — GABAPENTIN 600 MG PO TABS: 300.0000 mg | ORAL_TABLET | Freq: Three times a day (TID) | ORAL | 2 refills | Status: DC

## 2020-02-04 NOTE — Telephone Encounter (Signed)
Pt not req for Auth# [id number].

## 2020-02-04 NOTE — Progress Notes (Signed)
Pt state right arm that travel down to his tingling in pinky and ring finger. Pt state feels numbness in his fingers. Pt state he feels pain in his right elbow. Pt is left handed. Pt state strength down he feel the pain from his neck down to his arm.   Numeric Pain Rating Scale and Functional Assessment Average Pain 0   In the last MONTH (on 0-10 scale) has pain interfered with the following?  1. General activity like being  able to carry out your everyday physical activities such as walking, climbing stairs, carrying groceries, or moving a chair?  Rating(10)

## 2020-02-04 NOTE — Telephone Encounter (Signed)
Needs auth for [id number]- left C7-T1 [place]. Scheduled for 10/21 at 1600 with driver and no blood thinners.

## 2020-02-05 ENCOUNTER — Encounter: Payer: Self-pay | Admitting: Physical Medicine and Rehabilitation

## 2020-02-05 NOTE — Procedures (Signed)
EMG & NCV Findings: Evaluation of the left median motor nerve showed decreased conduction velocity (Elbow-Wrist, 48 m/s).  The left ulnar motor nerve showed decreased conduction velocity (B Elbow-Wrist, 50 m/s) and decreased conduction velocity (A Elbow-B Elbow, 50 m/s).  The left median (across palm) sensory nerve showed prolonged distal peak latency (Wrist, 3.9 ms).  All remaining nerves (as indicated in the following tables) were within normal limits.    All examined muscles (as indicated in the following table) showed no evidence of electrical instability.    Impression: The above electrodiagnostic study is ABNORMAL and reveals evidence of a mild right median nerve entrapment at the wrist affecting sensory components. This is seemingly asymptomatic.  There is no significant electrodiagnostic evidence of any other focal nerve entrapment, brachial plexopathy or cervical radiculopathy.  **This particular electrodiagnostic study cannot rule out chemical radiculitis or sensory only radiculopathy.  Recommendations: 1.  Follow-up with referring physician. 2.  Continue current management of symptoms. 3.  Suggest diagnostic cervical epidural injection.  ___________________________ Naaman Plummer FAAPMR Board Certified, American Board of Physical Medicine and Rehabilitation    Nerve Conduction Studies Anti Sensory Summary Table   Stim Site NR Peak (ms) Norm Peak (ms) P-T Amp (V) Norm P-T Amp Site1 Site2 Delta-P (ms) Dist (cm) Vel (m/s) Norm Vel (m/s)  Left Median Acr Palm Anti Sensory (2nd Digit)  30.1C  Wrist    *3.9 <3.6 40.4 >10 Wrist Palm 1.9 0.0    Palm    2.0 <2.0 20.3         Left Radial Anti Sensory (Base 1st Digit)  29.9C  Wrist    2.3 <3.1 19.0  Wrist Base 1st Digit 2.3 0.0    Left Ulnar Anti Sensory (5th Digit)  30.4C  Wrist    3.6 <3.7 23.9 >15.0 Wrist 5th Digit 3.6 14.0 39 >38   Motor Summary Table   Stim Site NR Onset (ms) Norm Onset (ms) O-P Amp (mV) Norm O-P Amp Site1  Site2 Delta-0 (ms) Dist (cm) Vel (m/s) Norm Vel (m/s)  Left Median Motor (Abd Poll Brev)  30.3C  Wrist    4.0 <4.2 8.5 >5 Elbow Wrist 4.6 22.0 *48 >50  Elbow    8.6  8.8         Left Ulnar Motor (Abd Dig Min)  30.3C  Wrist    3.6 <4.2 10.4 >3 B Elbow Wrist 4.2 21.0 *50 >53  B Elbow    7.8  7.7  A Elbow B Elbow 2.0 10.0 *50 >53  A Elbow    9.8  7.5          EMG   Side Muscle Nerve Root Ins Act Fibs Psw Amp Dur Poly Recrt Int Dennie Bible Comment  Left 1stDorInt Ulnar C8-T1 Nml Nml Nml Nml Nml 0 Nml Nml   Left Abd Poll Brev Median C8-T1 Nml Nml Nml Nml Nml 0 Nml Nml   Left ExtDigCom   Nml Nml Nml Nml Nml 0 Nml Nml   Left Triceps Radial C6-7-8 Nml Nml Nml Nml Nml 0 Nml Nml   Left Deltoid Axillary C5-6 Nml Nml Nml Nml Nml 0 Nml Nml     Nerve Conduction Studies Anti Sensory Left/Right Comparison   Stim Site L Lat (ms) R Lat (ms) L-R Lat (ms) L Amp (V) R Amp (V) L-R Amp (%) Site1 Site2 L Vel (m/s) R Vel (m/s) L-R Vel (m/s)  Median Acr Palm Anti Sensory (2nd Digit)  30.1C  Wrist *3.9   40.4  Wrist Palm     Palm 2.0   20.3         Radial Anti Sensory (Base 1st Digit)  29.9C  Wrist 2.3   19.0   Wrist Base 1st Digit     Ulnar Anti Sensory (5th Digit)  30.4C  Wrist 3.6   23.9   Wrist 5th Digit 39     Motor Left/Right Comparison   Stim Site L Lat (ms) R Lat (ms) L-R Lat (ms) L Amp (mV) R Amp (mV) L-R Amp (%) Site1 Site2 L Vel (m/s) R Vel (m/s) L-R Vel (m/s)  Median Motor (Abd Poll Brev)  30.3C  Wrist 4.0   8.5   Elbow Wrist *48    Elbow 8.6   8.8         Ulnar Motor (Abd Dig Min)  30.3C  Wrist 3.6   10.4   B Elbow Wrist *50    B Elbow 7.8   7.7   A Elbow B Elbow *50    A Elbow 9.8   7.5            Waveforms:

## 2020-02-05 NOTE — Progress Notes (Signed)
Jack HeyStephen W [place]op[place] - 64 y.o. male MRN [id number]  Date of birth: 02-05-1956  Office Visit Note: Visit Date: 02/04/2020 PCP: Pincus SanesBurns, Stacy J, MD Referred by: Pincus SanesBurns, Stacy J, MD  Subjective: Chief Complaint  Patient presents with  . Right Hand - Numbness  . Right Arm - Pain  . Right Shoulder - Pain  . Right Elbow - Pain   HPI: Jack Wood is a 64 y.o. male who comes in today For new patient consultation at the request of his primary care provider Dr. Cheryll CockayneStacy Burns for chronic worsening severe right neck shoulder and arm and elbow pain with paresthesias into the hand.  The patient reports that in mid June of this year he had a deep tissue massage which she does routinely get and after the massage about 2 days later he had significant pain in the right shoulder and arm with paresthesia.  He also reports that he had significant bruising around the upper back where the massage was done.  He doesn't necessarily get the same masseuse every time.  He has not had anything experienced after massage like this before.  He has no history of cervical spine issues other than chronic neck pain off and on for which she did get massage and he has had activity modification at times.  He reports a history of lumbar spine issues that resolved once he retired.  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  He reports no other traumatic events or other inciting events at the time.  Pain was progressively worsened over the right shoulder elbow and down into the hand.  The paresthesias were into the fourth and fifth digits.  He does endorse elbow pain on the right.  Patient is left-hand dominant.  He doesn't really endorse much in the way of average pain but it is episodic.  It is somewhat better now with the use of gabapentin for which he takes 400 in the morning and 500 mg in the evening.  He uses 100 mg capsules.  He also uses ibuprofen that seems to help when he takes it.  When his symptoms didn't help initially with activity  modification and medication an MRI of the cervical spine was obtained.  The report is reviewed below in her notes and the imaging and spine model is reviewed.  He actually has significant spondylitic change in the mid cervical region.  With reversal of lordosis as well as mild anterior listhesis of C3 on 4 and retrolisthesis of C6 on 7.  He has at least moderate stenosis centrally at C5-6 and he has uncovertebral spurring with by foraminal narrowing at multiple levels.  At C7-T1 there is moderate right foraminal narrowing.  Incidental finding with his tonsils was reevaluated with further imaging.  At that point given the findings on the MRI he was referred to Dr. Coletta MemosKyle Cabbell at [facility].  Unfortunately the patient reports to me that he didn't have a very good experience there and felt like the physician was somewhat dismissive and the evaluation was very quick.  I did review the notes from Dr. Franky Machoabbell and there really was no stated plan at that point.  The patient left with the impression that he had normal changes of the cervical spine for his age.  The patient also has a history of diabetes.  This has been stated to be diet controlled.  Last hemoglobin A1c earlier this month was 6.9.  He also has a history of obstructive sleep  apnea.  He has not had prior electrodiagnostic studies but has had some history of hand pain and wrist pain in the past.  Review of Systems  Musculoskeletal: Positive for joint pain and neck pain.  Neurological: Positive for tingling.  All other systems reviewed and are negative.  Otherwise per HPI.  Assessment & Plan: Visit Diagnoses:  1. Paresthesia of skin   2. Cervical radiculopathy   3. Cervicalgia   4. Cervical disc disorder with radiculopathy   5. Spinal stenosis of cervical region     Plan: Findings:  Chronic worsening severe neck and shoulder and elbow pain with radicular type pain down the arm with paresthesias into the fourth and  fifth digit.  Obviously differential diagnosis would be cervical radiculopathy more of a C8 distribution versus ulnar nerve neuropathy of the elbow.  Clinical exam is more consistent with cervical radiculopathy.  MRI findings are pretty significant with by foraminal narrowing multilevel pretty severe spondylosis with at least moderate stenosis at C5-6.  Not really deemed a surgical candidate at this point by Dr. Franky Macho but with somewhat of an interesting evaluation per the patient.  He has maintained some control of his pain level with gabapentin and ibuprofen.  We did decide today to complete electrodiagnostic study of the right upper limb.  Results can be seen below but basically showed a very mild carpal tunnel issue which would not give him the symptoms he is having.  It did not show a frank radiculopathy.  Nonetheless electrodiagnostic study for radiculopathy can miss a demyelinating radiculopathy or a sensory axonal radiculopathy.  Patient has had no weakness noted.  Given the findings above the neck step is diagnostic and hopefully therapeutic cervical epidural injection which we will get scheduled and approved.  Patient does want to proceed with that.  Ulnar neuropathy at the elbow was not noted in the test is fairly sensitive for that.   Impression: The above electrodiagnostic study is ABNORMAL and reveals evidence of a mild right median nerve entrapment at the wrist affecting sensory components. This is seemingly asymptomatic.  There is no significant electrodiagnostic evidence of any other focal nerve entrapment, brachial plexopathy or cervical radiculopathy.  **This particular electrodiagnostic study cannot rule out chemical radiculitis or sensory only radiculopathy.  Recommendations: 1.  Follow-up with referring physician. 2.  Continue current management of symptoms. 3.  Suggest diagnostic cervical epidural injection. Meds & Orders:  Meds ordered this encounter  Medications  . gabapentin  (NEURONTIN) 600 MG tablet    Sig: Take 0.5 tablets (300 mg total) by mouth 3 (three) times daily.    Dispense:  90 tablet    Refill:  2    Orders Placed This Encounter  Procedures  . NCV with EMG (electromyography)    Follow-up: Return for Right C7-T1 interlaminar epidural steroid injection.   Procedures: No procedures performed  EMG & NCV Findings: Evaluation of the left median motor nerve showed decreased conduction velocity (Elbow-Wrist, 48 m/s).  The left ulnar motor nerve showed decreased conduction velocity (B Elbow-Wrist, 50 m/s) and decreased conduction velocity (A Elbow-B Elbow, 50 m/s).  The left median (across palm) sensory nerve showed prolonged distal peak latency (Wrist, 3.9 ms).  All remaining nerves (as indicated in the following tables) were within normal limits.    All examined muscles (as indicated in the following table) showed no evidence of electrical instability.    Impression: The above electrodiagnostic study is ABNORMAL and reveals evidence of a mild right median nerve entrapment at  the wrist affecting sensory components. This is seemingly asymptomatic.  There is no significant electrodiagnostic evidence of any other focal nerve entrapment, brachial plexopathy or cervical radiculopathy.  **This particular electrodiagnostic study cannot rule out chemical radiculitis or sensory only radiculopathy.  Recommendations: 1.  Follow-up with referring physician. 2.  Continue current management of symptoms. 3.  Suggest diagnostic cervical epidural injection.  ___________________________ Naaman Plummer FAAPMR Board Certified, American Board of Physical Medicine and Rehabilitation    Nerve Conduction Studies Anti Sensory Summary Table   Stim Site NR Peak (ms) Norm Peak (ms) P-T Amp (V) Norm P-T Amp Site1 Site2 Delta-P (ms) Dist (cm) Vel (m/s) Norm Vel (m/s)  Left Median Acr Palm Anti Sensory (2nd Digit)  30.1C  Wrist    *3.9 <3.6 40.4 >10 Wrist Palm 1.9 0.0    Palm     2.0 <2.0 20.3         Left Radial Anti Sensory (Base 1st Digit)  29.9C  Wrist    2.3 <3.1 19.0  Wrist Base 1st Digit 2.3 0.0    Left Ulnar Anti Sensory (5th Digit)  30.4C  Wrist    3.6 <3.7 23.9 >15.0 Wrist 5th Digit 3.6 14.0 39 >38   Motor Summary Table   Stim Site NR Onset (ms) Norm Onset (ms) O-P Amp (mV) Norm O-P Amp Site1 Site2 Delta-0 (ms) Dist (cm) Vel (m/s) Norm Vel (m/s)  Left Median Motor (Abd Poll Brev)  30.3C  Wrist    4.0 <4.2 8.5 >5 Elbow Wrist 4.6 22.0 *48 >50  Elbow    8.6  8.8         Left Ulnar Motor (Abd Dig Min)  30.3C  Wrist    3.6 <4.2 10.4 >3 B Elbow Wrist 4.2 21.0 *50 >53  B Elbow    7.8  7.7  A Elbow B Elbow 2.0 10.0 *50 >53  A Elbow    9.8  7.5          EMG   Side Muscle Nerve Root Ins Act Fibs Psw Amp Dur Poly Recrt Int Dennie Bible Comment  Left 1stDorInt Ulnar C8-T1 Nml Nml Nml Nml Nml 0 Nml Nml   Left Abd Poll Brev Median C8-T1 Nml Nml Nml Nml Nml 0 Nml Nml   Left ExtDigCom   Nml Nml Nml Nml Nml 0 Nml Nml   Left Triceps Radial C6-7-8 Nml Nml Nml Nml Nml 0 Nml Nml   Left Deltoid Axillary C5-6 Nml Nml Nml Nml Nml 0 Nml Nml     Nerve Conduction Studies Anti Sensory Left/Right Comparison   Stim Site L Lat (ms) R Lat (ms) L-R Lat (ms) L Amp (V) R Amp (V) L-R Amp (%) Site1 Site2 L Vel (m/s) R Vel (m/s) L-R Vel (m/s)  Median Acr Palm Anti Sensory (2nd Digit)  30.1C  Wrist *3.9   40.4   Wrist Palm     Palm 2.0   20.3         Radial Anti Sensory (Base 1st Digit)  29.9C  Wrist 2.3   19.0   Wrist Base 1st Digit     Ulnar Anti Sensory (5th Digit)  30.4C  Wrist 3.6   23.9   Wrist 5th Digit 39     Motor Left/Right Comparison   Stim Site L Lat (ms) R Lat (ms) L-R Lat (ms) L Amp (mV) R Amp (mV) L-R Amp (%) Site1 Site2 L Vel (m/s) R Vel (m/s) L-R Vel (m/s)  Median Motor (Abd Poll Brev)  30.3C  Wrist 4.0   8.5   Elbow Wrist *48    Elbow 8.6   8.8         Ulnar Motor (Abd Dig Min)  30.3C  Wrist 3.6   10.4   B Elbow Wrist *50    B Elbow 7.8   7.7   A  Elbow B Elbow *50    A Elbow 9.8   7.5            Waveforms:             Clinical History: MRI CERVICAL SPINE WITHOUT CONTRAST  TECHNIQUE: Multiplanar, multisequence MR imaging of the cervical spine was performed. No intravenous contrast was administered.  COMPARISON:  Radiographs of the cervical spine 11/08/2019  FINDINGS: Alignment: Reversal of the expected cervical lordosis. Trace C3-C4 grade 1 anterolisthesis. Trace C6-C7 grade 1 retrolisthesis  Vertebrae: Vertebral body height is maintained. Trace degenerative endplate edema at Z6-X0, C5-C6 and C6-C7. Multilevel degenerative endplate irregularity.  Cord: No abnormality is identified within included portions of the posterior fossa.  Posterior Fossa, vertebral arteries, paraspinal tissues: No abnormality is identified within included portions of the posterior fossa. Flow voids preserved within the imaged cervical vertebral arteries. There is a nonspecific 11 mm T2 hyperintense ovoid lesion in the region of the right palatine tonsil (series 5, image 2).  Disc levels:  Multilevel disc degeneration. Most notably, there is moderate/advanced disc degeneration at C4-C5 and C6-C7 and moderate disc degeneration at C5-C6.  C2-C3: Bilateral disc osteophyte ridge/uncinate hypertrophy. Facet/ligamentum flavum hypertrophy. No significant spinal canal stenosis. Bilateral neural foraminal narrowing (mild right, moderate left).  C3-C4: Trace anterolisthesis. Disc uncovering. Uncinate, facet and ligamentum flavum hypertrophy. Mild spinal canal stenosis. Moderate/severe bilateral neural foraminal narrowing  C4-C5: Posterior disc osteophyte complex with right greater than left disc osteophyte ridge/uncinate hypertrophy. Facet hypertrophy. Mild spinal canal stenosis. Bilateral neural foraminal narrowing (severe right, moderate left).  C5-C6: Posterior disc osteophyte complex. Bilateral disc  osteophyte ridge/uncinate hypertrophy. Moderate spinal canal stenosis. There is contact upon the ventral spinal cord with mild flattening. Bilateral neural foraminal narrowing (moderate/severe right, severe left)  C6-C7: Posterior disc osteophyte complex. Bilateral disc osteophyte ridge/uncinate hypertrophy. Mild spinal canal stenosis with possible contact upon the ventral spinal cord. Bilateral neural foraminal narrowing (moderate/severe right, severe left).  C7-T1: Shallow disc bulge. Right-sided disc osteophyte ridge. Mild facet/ligamentum flavum hypertrophy. No significant spinal canal stenosis. Moderate right neural foraminal narrowing.  Impression #4 will be called to the ordering clinician or representative by the Radiologist Assistant, and communication documented in the PACS or [employer].  IMPRESSION: Cervical spondylosis as outlined.  At C5-C6, there is moderate disc degeneration. A posterior disc osteophyte complex contributes to multifactorial moderate spinal canal stenosis, contacting and mildly flattening the ventral spinal cord. Bilateral neural foraminal narrowing at this level (moderate/severe right, severe left).  No more than mild spinal canal stenosis at the remaining levels. Additional sites of neural foraminal narrowing greater bilaterally at C3-C4 (moderate/severe), bilaterally at C4-C5 (severe right, moderate left), bilaterally at C6-C7 (moderate/severe right, severe left) and on the right at C7-T1 (moderate).  11 mm T2 hyperintense lesion in the region of the right palatine tonsil which is incompletely assessed on the current examination. This may reflect a cyst, but remains indeterminate in etiology. Direct visualization recommended. Additionally, consider contrast-enhanced neck CT for further evaluation.   Electronically Signed   By: Jackey Loge DO   On: 11/24/2019 14:11   He reports that he has never smoked. He  has never used  smokeless tobacco.  Recent Labs    01/27/20 0940  HGBA1C 6.9*    Objective:  VS:  HT:    WT:   BMI:     BP:   HR: bpm  TEMP: ( )  RESP:  Physical Exam Vitals and nursing note reviewed.  Constitutional:      General: He is not in acute distress.    Appearance: Normal appearance. He is not ill-appearing.  HENT:     Head: Normocephalic and atraumatic.     Right Ear: External ear normal.     Left Ear: External ear normal.  Eyes:     Extraocular Movements: Extraocular movements intact.  Cardiovascular:     Rate and Rhythm: Normal rate.     Pulses: Normal pulses.  Abdominal:     General: There is no distension.     Palpations: Abdomen is soft.  Musculoskeletal:        General: No signs of injury.     Cervical back: Neck supple. Tenderness present. No rigidity.     Right lower leg: No edema.     Left lower leg: No edema.     Comments: Patient has good strength in the upper extremities with 5 out of 5 strength in wrist extension long finger flexion APB.  No intrinsic hand muscle atrophy.  Negative Hoffmann's test.  Patient did have positive Spurling's test on the right and negative on the left.  Sensation was grossly intact with subjective paresthesias sensation in more C8 distribution on the right.  He had a negative Tinel's at the elbow.  Lymphadenopathy:     Cervical: No cervical adenopathy.  Skin:    Findings: No erythema or rash.  Neurological:     General: No focal deficit present.     Mental Status: He is alert and oriented to person, place, and time.     Sensory: Sensory deficit present.     Motor: No weakness or abnormal muscle tone.     Coordination: Coordination normal.     Gait: Gait normal.  Psychiatric:        Mood and Affect: Mood normal.        Behavior: Behavior normal.     Ortho Exam  Imaging: No results found.  Past Medical/Family/Surgical/Social History: Medications & Allergies reviewed per EMR, new medications updated. Patient Active Problem  List   Diagnosis Date Noted  . Cervical radiculopathy 01/07/2020  . OSA (obstructive sleep apnea), severe 03/14/2019  . Cough 06/15/2018  . Hyperlipidemia 02/27/2018  . Right wrist pain-chronic arthritis 02/24/2018  . Acute pain of left shoulder 02/24/2018  . Diabetes (HCC) 04/04/2016  . Acne rosacea    Past Medical History:  Diagnosis Date  . Acne rosacea   . Diverticula, colon    diverticulitis in 08/2010, 11/2014   Family History  Problem Relation Age of Onset  . Osteoporosis Mother   . Valvular heart disease Mother   . Coronary artery disease Father   . Valvular heart disease Father   . Hypertension Father   . Cirrhosis Sister 12       EtOH related   Past Surgical History:  Procedure Laterality Date  . APPENDECTOMY  12/2009   Social History   Occupational History  . Not on file  Tobacco Use  . Smoking status: Never Smoker  . Smokeless tobacco: Never Used  Substance and Sexual Activity  . Alcohol use: Yes    Alcohol/week: 0.0 standard drinks    Comment:  social  . Drug use: No  . Sexual activity: Not on file

## 2020-02-13 ENCOUNTER — Encounter: Payer: Self-pay | Admitting: Physical Medicine and Rehabilitation

## 2020-02-13 ENCOUNTER — Ambulatory Visit: Payer: Self-pay

## 2020-02-13 ENCOUNTER — Ambulatory Visit (INDEPENDENT_AMBULATORY_CARE_PROVIDER_SITE_OTHER): Payer: BC Managed Care – PPO | Admitting: Physical Medicine and Rehabilitation

## 2020-02-13 ENCOUNTER — Other Ambulatory Visit: Payer: Self-pay

## 2020-02-13 VITALS — BP 109/80 | HR 71

## 2020-02-13 DIAGNOSIS — M5412 Radiculopathy, cervical region: Secondary | ICD-10-CM

## 2020-02-13 DIAGNOSIS — M25521 Pain in right elbow: Secondary | ICD-10-CM | POA: Diagnosis not present

## 2020-02-13 DIAGNOSIS — M542 Cervicalgia: Secondary | ICD-10-CM

## 2020-02-13 MED ORDER — METHYLPREDNISOLONE ACETATE 80 MG/ML IJ SUSP: 80.0000 mg | Freq: Once | INTRAMUSCULAR | Status: DC

## 2020-02-13 NOTE — Progress Notes (Signed)
Pt state pain in his right arm and elbow. Pt state leaning back or bending forward makes the pain worse. Pt state he take pain meds to helps ease the pain.  Numeric Pain Rating Scale and Functional Assessment Average Pain 2   In the last MONTH (on 0-10 scale) has pain interfered with the following?  1. General activity like being  able to carry out your everyday physical activities such as walking, climbing stairs, carrying groceries, or moving a chair?  Rating(4)   +Driver, -BT, -Dye Allergies.

## 2020-03-16 ENCOUNTER — Telehealth: Payer: Self-pay

## 2020-03-16 NOTE — Telephone Encounter (Signed)
Should come in sooner for this reason

## 2020-03-16 NOTE — Telephone Encounter (Signed)
Yes - December

## 2020-03-18 NOTE — Telephone Encounter (Signed)
Appointment made

## 2020-03-29 DIAGNOSIS — H348192 Central retinal vein occlusion, unspecified eye, stable: Secondary | ICD-10-CM | POA: Insufficient documentation

## 2020-03-29 NOTE — Progress Notes (Signed)
Subjective:    Patient ID: Jack Wood, male    DOB: 05-Feb-1956, 64 y.o.   MRN: [id number]  HPI The patient is here for follow up for a recent retinal vein occlusion.  He sees Dr Bunnie Pion at [facility] - vein occlusion with some bleeding in back of the eye.    He has changed his eating and is exercising.  He has lost weight.  He is compliant with a diabetic diet.     Medications and allergies reviewed with patient and updated if appropriate.  Patient Active Problem List   Diagnosis Date Noted  . Retinal vein occlusion 03/29/2020  . Cervical radiculopathy 01/07/2020  . OSA (obstructive sleep apnea), severe 03/14/2019  . Cough 06/15/2018  . Hyperlipidemia 02/27/2018  . Right wrist pain-chronic arthritis 02/24/2018  . Acute pain of left shoulder 02/24/2018  . Diabetes (HCC) 04/04/2016  . Acne rosacea     Current Outpatient Medications on File Prior to Visit  Medication Sig Dispense Refill  . PICATO 0.015 % GEL Take by mouth as needed.     Current Facility-Administered Medications on File Prior to Visit  Medication Dose Route Frequency Provider Last Rate Last Admin  . methylPREDNISolone acetate (DEPO-MEDROL) injection 80 mg  80 mg Other Once Tyrell Antonio, MD        Past Medical History:  Diagnosis Date  . Acne rosacea   . Diverticula, colon    diverticulitis in 08/2010, 11/2014    Past Surgical History:  Procedure Laterality Date  . APPENDECTOMY  12/2009    Social History   Socioeconomic History  . Marital status: Married    Spouse name: Not on file  . Number of children: Not on file  . Years of education: Not on file  . Highest education level: Not on file  Occupational History  . Not on file  Tobacco Use  . Smoking status: Never Smoker  . Smokeless tobacco: Never Used  Substance and Sexual Activity  . Alcohol use: Yes    Alcohol/week: 0.0 standard drinks    Comment: social  . Drug use: No  . Sexual activity: Not on file  Other  Topics Concern  . 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 Difficulty of Paying Living Expenses: Not on file  Food Insecurity:   . Worried About Programme researcher, broadcasting/film/video in the Last Year: Not on file  . Ran Out of Food in the Last Year: Not on file  Transportation Needs:   . Lack of Transportation (Medical): Not on file  . Lack of Transportation (Non-Medical): Not on file  Physical Activity:   . Days of Exercise per Week: Not on file  . Minutes of Exercise per Session: Not on file  Stress:   . Feeling of Stress : Not on file  Social Connections:   . Frequency of Communication with Friends and Family: Not on file  . Frequency of Social Gatherings with Friends and Family: Not on file  . Attends Religious Services: Not on file  . Active Member of Clubs or Organizations: Not on file  . Attends Banker Meetings: Not on file  . Marital Status: Not on file    Family History  Problem Relation Age of Onset  . Osteoporosis Mother   . Valvular heart disease Mother   .  Coronary artery disease Father   . Valvular heart disease Father   . Hypertension Father   . Cirrhosis Sister 44       EtOH related    Review of Systems  Constitutional: Negative for fever.  Respiratory: Negative for cough, shortness of breath and wheezing.   Cardiovascular: Negative for chest pain, palpitations and leg swelling.  Neurological: Negative for light-headedness and headaches.       Objective:   Vitals:   03/30/20 1053  BP: 110/72  Pulse: 82  Temp: 98.1 F (36.7 C)  SpO2: 98%   BP Readings from Last 3 Encounters:  03/30/20 110/72  02/13/20 109/80  01/27/20 118/76   Wt Readings from Last 3 Encounters:  03/30/20 172 lb (78 kg)  01/27/20 185 lb (83.9 kg)  01/07/20 191 lb (86.6 kg)   Body mass index is 25.4 kg/m.   Physical Exam    Constitutional: Appears  well-developed and well-nourished. No distress.  HENT:  Head: Normocephalic and atraumatic.  Neck: Neck supple. No tracheal deviation present. No thyromegaly present.  No cervical lymphadenopathy Cardiovascular: Normal rate, regular rhythm and normal heart sounds.   No murmur heard. No carotid bruit .  No edema Pulmonary/Chest: Effort normal and breath sounds normal. No respiratory distress. No has no wheezes. No rales.  Skin: Skin is warm and dry. Not diaphoretic.  Psychiatric: Normal mood and affect. Behavior is normal.      Assessment & Plan:     20 minutes were spent face-to-face with the patient discussed his recent eye complications and improving his risk factors for CVD, ordering labs, tests, discussing adding a statin and other ways of evaluating his cardiovascular risk  See Problem List for Assessment and Plan of chronic medical problems.    This visit occurred during the SARS-CoV-2 public health emergency.  Safety protocols were in place, including screening questions prior to the visit, additional usage of staff PPE, and extensive cleaning of exam room while observing appropriate contact time as indicated for disinfecting solutions.

## 2020-03-29 NOTE — Patient Instructions (Addendum)
  Blood work was ordered.     Medications changes include :   none    An ultrasound of your carotid arteries was ordered.

## 2020-03-30 ENCOUNTER — Other Ambulatory Visit: Payer: Self-pay

## 2020-03-30 ENCOUNTER — Ambulatory Visit (INDEPENDENT_AMBULATORY_CARE_PROVIDER_SITE_OTHER): Payer: BC Managed Care – PPO | Admitting: Internal Medicine

## 2020-03-30 ENCOUNTER — Encounter: Payer: Self-pay | Admitting: Internal Medicine

## 2020-03-30 VITALS — BP 110/72 | HR 82 | Temp 98.1°F | Ht 69.0 in | Wt 172.0 lb

## 2020-03-30 DIAGNOSIS — E7849 Other hyperlipidemia: Secondary | ICD-10-CM | POA: Diagnosis not present

## 2020-03-30 DIAGNOSIS — H348192 Central retinal vein occlusion, unspecified eye, stable: Secondary | ICD-10-CM | POA: Diagnosis not present

## 2020-03-30 DIAGNOSIS — E1169 Type 2 diabetes mellitus with other specified complication: Secondary | ICD-10-CM | POA: Diagnosis not present

## 2020-03-30 LAB — LIPID PANEL
Cholesterol: 221 mg/dL — ABNORMAL HIGH (ref 0–200)
HDL: 41.4 mg/dL (ref >39.00)
LDL Cholesterol: 153 mg/dL — ABNORMAL HIGH (ref 0–99)
NonHDL: 179.22
Total CHOL/HDL Ratio: 5
Triglycerides: 131 mg/dL (ref 0.0–149.0)
VLDL: 26.2 mg/dL (ref 0.0–40.0)

## 2020-03-30 LAB — COMPREHENSIVE METABOLIC PANEL
ALT: 16 U/L (ref 0–53)
AST: 15 U/L (ref 0–37)
Albumin: 4.7 g/dL (ref 3.5–5.2)
Alkaline Phosphatase: 61 U/L (ref 39–117)
BUN: 18 mg/dL (ref 6–23)
CO2: 30 mEq/L (ref 19–32)
Calcium: 9.6 mg/dL (ref 8.4–10.5)
Chloride: 102 mEq/L (ref 96–112)
Creatinine, Ser: 1.04 mg/dL (ref 0.40–1.50)
GFR: 77.15 mL/min (ref >60.00)
Glucose, Bld: 103 mg/dL — ABNORMAL HIGH (ref 70–99)
Potassium: 4.2 mEq/L (ref 3.5–5.1)
Sodium: 139 mEq/L (ref 135–145)
Total Bilirubin: 0.8 mg/dL (ref 0.2–1.2)
Total Protein: 7.9 g/dL (ref 6.0–8.3)

## 2020-03-30 LAB — HEMOGLOBIN A1C: Hgb A1c MFr Bld: 5.8 % (ref 4.6–6.5)

## 2020-03-30 NOTE — Assessment & Plan Note (Signed)
New problem See Dr Bunnie Pion No vision changes or loss Has f/u with Dr Karie Soda Will check carotid US Has improved diet and will work on risk factors

## 2020-03-30 NOTE — Assessment & Plan Note (Signed)
Chronic Check lipid panel  Lifestyle controlled - he is a diabetic and should be on a statin -- sugars likely improved with diet changes and weight loss - will see what carotid US shows and can consider cardiac ct continue exercise and healthy diet

## 2020-03-30 NOTE — Assessment & Plan Note (Signed)
Chronic Diet controlled He is compliant with a diabetic diet, exercising and has lost weight Recheck a1c

## 2020-04-10 ENCOUNTER — Encounter (INDEPENDENT_AMBULATORY_CARE_PROVIDER_SITE_OTHER): Payer: Self-pay | Admitting: Otolaryngology

## 2020-04-10 ENCOUNTER — Other Ambulatory Visit: Payer: Self-pay

## 2020-04-10 ENCOUNTER — Ambulatory Visit (INDEPENDENT_AMBULATORY_CARE_PROVIDER_SITE_OTHER): Payer: BC Managed Care – PPO | Admitting: Otolaryngology

## 2020-04-10 VITALS — Temp 97.9°F

## 2020-04-10 DIAGNOSIS — J358 Other chronic diseases of tonsils and adenoids: Secondary | ICD-10-CM

## 2020-04-10 NOTE — Progress Notes (Signed)
HPI: Jack Wood is a 64 y.o. male who presents is referred by his PCP Dr. Lawerance Bach for evaluation of right tonsil lesion.  Apparently patient was having some problems with neurologic problems with his arms and had a cervical MRI scan performed several months ago.  This showed an lesion within the right tonsil.  A subsequent CT scan was performed and on review of the CT scan there showed a cystic lesion within the deeper portion of the right tonsil. Patient has no symptoms regarding this.  No sore throat.  No previous history of tonsil abscess..  Past Medical History:  Diagnosis Date   Acne rosacea    Diverticula, colon    diverticulitis in 08/2010, 11/2014   Past Surgical History:  Procedure Laterality Date   APPENDECTOMY  12/2009   Social History   Socioeconomic History   Marital status: Married    Spouse name: Not on file   Number of children: Not on file   Years of education: Not on file   Highest education level: Not on file  Occupational History   Not on file  Tobacco Use   Smoking status: Never Smoker   Smokeless tobacco: Never Used  Substance and Sexual Activity   Alcohol use: Yes    Alcohol/week: 0.0 standard drinks    Comment: social   Drug use: No   Sexual activity: Not on file  Other Topics Concern   Not on file  Social History Narrative   Married, lives with wife   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 Authorizing Provider  PICATO 0.015 % GEL Take by mouth as needed. 03/07/19   [provider]     Positive ROS: Otherwise negative  All other systems have been reviewed and were otherwise negative with the exception of those mentioned in the HPI and as above.  Physical Exam: Constitutional: Alert, well-appearing, no acute distress Ears: External ears without lesions or tenderness. Ear canals are clear bilaterally with intact, clear TMs.  Nasal: External nose without lesions.. Clear nasal passages Oral: Lips and gums without lesions. Tongue and palate mucosa without lesions. Posterior oropharynx clear.  Tonsils appear fairly symmetric perhaps the right tonsil slightly larger than the left.  Indirect laryngoscopy revealed a clear base of tongue.  Palpation of the right tonsil was soft and on palpation this is consistent with a probable tonsillar cyst. Neck: No palpable adenopathy or masses.  He has no palpable adenopathy along the upper jugular chain of lymph nodes on the right side. Respiratory: Breathing comfortably  Skin: No facial/neck lesions or rash noted.  Procedures  Assessment: Incidental finding on MRI scan of the right tonsil is most consistent with tonsillar cyst.  This is not firm to palpation and on review of the CT scan is most consistent with a cystic type lesion.  He has  no adenopathy on clinical exam or review of CT scan.  Plan: Discussed with patient today concerning benign nature of clinical findings as well as review of CT scan. Suggested follow-up in 6 months for recheck. I would not recommend excision or biopsy unless this enlarges or he develops any adenopathy in the neck. This can be safely followed.   Narda Bonds, MD   CC:

## 2020-04-22 ENCOUNTER — Ambulatory Visit (HOSPITAL_COMMUNITY)
Admission: RE | Admit: 2020-04-22 | Discharge: 2020-04-22 | Disposition: A | Discharge status: Home / Self Care | Payer: BC Managed Care – PPO | Source: Ambulatory Visit | Attending: Cardiovascular Disease | Admitting: Cardiovascular Disease

## 2020-04-22 ENCOUNTER — Other Ambulatory Visit (HOSPITAL_COMMUNITY): Payer: Self-pay | Admitting: Internal Medicine

## 2020-04-22 ENCOUNTER — Other Ambulatory Visit: Payer: Self-pay

## 2020-04-22 DIAGNOSIS — I6521 Occlusion and stenosis of right carotid artery: Secondary | ICD-10-CM

## 2020-04-22 DIAGNOSIS — E7849 Other hyperlipidemia: Secondary | ICD-10-CM | POA: Insufficient documentation

## 2020-04-22 DIAGNOSIS — H348192 Central retinal vein occlusion, unspecified eye, stable: Secondary | ICD-10-CM

## 2020-04-27 ENCOUNTER — Encounter: Payer: Self-pay | Admitting: Physical Medicine and Rehabilitation

## 2020-04-27 NOTE — Progress Notes (Signed)
Jack Wood - 65 y.o. male MRN [id number]  Date of birth: 1956/01/04  Office Visit Note: Visit Date: 02/13/2020 PCP: Pincus Sanes, MD Referred by: Pincus Sanes, MD  Subjective: Chief Complaint  Patient presents with  . Right Arm - Pain  . Right Elbow - Pain   HPI: Jack Wood is a 65 y.o. male who comes in today For planned right C7-T1 interlaminar epidural steroid injection.  Patient actually is doing better than when we saw him for first evaluation.  We did complete electrodiagnostic study as part of that evaluation.  He continues to have some pain in the right arm and elbow but is much better.  Pain is rated as a 2 out of 10.  He is better with position change of medication.  He is not have any red flag complaints.  We did talk at length today and decided to wait and see if he has any exacerbation of symptoms and could complete epidural injection at that time.  Review of Systems  Musculoskeletal: Positive for joint pain and neck pain.  All other systems reviewed and are negative.  Otherwise per HPI.  Assessment & Plan: Visit Diagnoses:    ICD-10-CM   1. Cervical radiculopathy  M54.12 DISCONTINUED: methylPREDNISolone acetate (DEPO-MEDROL) injection 80 mg    CANCELED: XR C-ARM NO REPORT    CANCELED: Epidural Steroid injection  2. Cervicalgia  M54.2   3. Pain in right elbow  M25.521      Plan: No additional findings.   Meds & Orders:  Meds ordered this encounter  Medications  . DISCONTD: methylPREDNISolone acetate (DEPO-MEDROL) injection 80 mg   No orders of the defined types were placed in this encounter.   Follow-up: Return if symptoms worsen or fail to improve.   Procedures: No procedures performed      Clinical History: MRI CERVICAL SPINE WITHOUT CONTRAST  TECHNIQUE: Multiplanar, multisequence MR imaging of the cervical spine was performed. No intravenous contrast was administered.  COMPARISON:  Radiographs of the cervical spine  11/08/2019  FINDINGS: Alignment: Reversal of the expected cervical lordosis. Trace C3-C4 grade 1 anterolisthesis. Trace C6-C7 grade 1 retrolisthesis  Vertebrae: Vertebral body height is maintained. Trace degenerative endplate edema at K4-M0, C5-C6 and C6-C7. Multilevel degenerative endplate irregularity.  Cord: No abnormality is identified within included portions of the posterior fossa.  Posterior Fossa, vertebral arteries, paraspinal tissues: No abnormality is identified within included portions of the posterior fossa. Flow voids preserved within the imaged cervical vertebral arteries. There is a nonspecific 11 mm T2 hyperintense ovoid lesion in the region of the right palatine tonsil (series 5, image 2).  Disc levels:  Multilevel disc degeneration. Most notably, there is moderate/advanced disc degeneration at C4-C5 and C6-C7 and moderate disc degeneration at C5-C6.  C2-C3: Bilateral disc osteophyte ridge/uncinate hypertrophy. Facet/ligamentum flavum hypertrophy. No significant spinal canal stenosis. Bilateral neural foraminal narrowing (mild right, moderate left).  C3-C4: Trace anterolisthesis. Disc uncovering. Uncinate, facet and ligamentum flavum hypertrophy. Mild spinal canal stenosis. Moderate/severe bilateral neural foraminal narrowing  C4-C5: Posterior disc osteophyte complex with right greater than left disc osteophyte ridge/uncinate hypertrophy. Facet hypertrophy. Mild spinal canal stenosis. Bilateral neural foraminal narrowing (severe right, moderate left).  C5-C6: Posterior disc osteophyte complex. Bilateral disc osteophyte ridge/uncinate hypertrophy. Moderate spinal canal stenosis. There is contact upon the ventral spinal cord with mild flattening. Bilateral neural foraminal narrowing (moderate/severe right, severe left)  C6-C7: Posterior disc osteophyte complex. Bilateral disc osteophyte ridge/uncinate hypertrophy. Mild spinal canal stenosis with  possible contact upon the ventral spinal cord. Bilateral neural foraminal narrowing (moderate/severe right, severe left).  C7-T1: Shallow disc bulge. Right-sided disc osteophyte ridge. Mild facet/ligamentum flavum hypertrophy. No significant spinal canal stenosis. Moderate right neural foraminal narrowing.  Impression #4 will be called to the ordering clinician or representative by the Radiologist Assistant, and communication documented in the PACS or [employer].  IMPRESSION: Cervical spondylosis as outlined.  At C5-C6, there is moderate disc degeneration. A posterior disc osteophyte complex contributes to multifactorial moderate spinal canal stenosis, contacting and mildly flattening the ventral spinal cord. Bilateral neural foraminal narrowing at this level (moderate/severe right, severe left).  No more than mild spinal canal stenosis at the remaining levels. Additional sites of neural foraminal narrowing greater bilaterally at C3-C4 (moderate/severe), bilaterally at C4-C5 (severe right, moderate left), bilaterally at C6-C7 (moderate/severe right, severe left) and on the right at C7-T1 (moderate).  11 mm T2 hyperintense lesion in the region of the right palatine tonsil which is incompletely assessed on the current examination. This may reflect a cyst, but remains indeterminate in etiology. Direct visualization recommended. Additionally, consider contrast-enhanced neck CT for further evaluation.   Electronically Signed   By: Jackey Loge DO   On: 11/24/2019 14:11   He reports that he has never smoked. He has never used smokeless tobacco.  Recent Labs    01/27/20 0940 03/30/20 1123  HGBA1C 6.9* 5.8    Objective:  VS:  HT:    WT:   BMI:     BP:109/80  HR:71bpm  TEMP: ( )  RESP:  Physical Exam Vitals and nursing note reviewed.  Constitutional:      General: He is not in acute distress.    Appearance: Normal appearance. He is not ill-appearing.   HENT:     Head: Normocephalic and atraumatic.     Right Ear: External ear normal.     Left Ear: External ear normal.  Eyes:     Extraocular Movements: Extraocular movements intact.  Cardiovascular:     Rate and Rhythm: Normal rate.     Pulses: Normal pulses.  Abdominal:     General: There is no distension.     Palpations: Abdomen is soft.  Musculoskeletal:        General: No signs of injury.     Cervical back: Neck supple. Tenderness present. No rigidity.     Right lower leg: No edema.     Left lower leg: No edema.     Comments: Patient has good strength in the upper extremities with 5 out of 5 strength in wrist extension long finger flexion APB.  No intrinsic hand muscle atrophy.  Negative Hoffmann's test.  Lymphadenopathy:     Cervical: No cervical adenopathy.  Skin:    Findings: No erythema or rash.  Neurological:     General: No focal deficit present.     Mental Status: He is alert and oriented to person, place, and time.     Sensory: No sensory deficit.     Motor: No weakness or abnormal muscle tone.     Coordination: Coordination normal.  Psychiatric:        Mood and Affect: Mood normal.        Behavior: Behavior normal.     Ortho Exam  Imaging: No results found.  Past Medical/Family/Surgical/Social History: Medications & Allergies reviewed per EMR, new medications updated. Patient Active Problem List   Diagnosis Date Noted  . Retinal vein occlusion 03/29/2020  . Cervical radiculopathy 01/07/2020  .  OSA (obstructive sleep apnea), severe 03/14/2019  . Cough 06/15/2018  . Hyperlipidemia 02/27/2018  . Right wrist pain-chronic arthritis 02/24/2018  . Acute pain of left shoulder 02/24/2018  . Diabetes ([place]) 04/04/2016  . Acne rosacea    Past Medical History:  Diagnosis Date  . Acne rosacea   . Diverticula, colon    diverticulitis in 08/2010, 11/2014   Family History  Problem Relation Age of Onset  . Osteoporosis Mother   . Valvular heart disease Mother    . Coronary artery disease Father   . Valvular heart disease Father   . Hypertension Father   . Cirrhosis Sister 31       EtOH related   Past Surgical History:  Procedure Laterality Date  . APPENDECTOMY  12/2009   Social History   Occupational History  . Not on file  Tobacco Use  . Smoking status: Never Smoker  . Smokeless tobacco: Never Used  Substance and Sexual Activity  . Alcohol use: Yes    Alcohol/week: 0.0 standard drinks    Comment: social  . Drug use: No  . Sexual activity: Not on file

## 2020-07-12 NOTE — Patient Instructions (Addendum)
    Blood work was ordered.      Medications changes include :   none     Please followup in 6 months  

## 2020-07-12 NOTE — Progress Notes (Signed)
Subjective:    Patient ID: Jack Wood, male    DOB: December 12, 1955, 64 y.o.   MRN: [id number]  HPI The patient is here for follow up of their chronic medical problems, including DM, hyperlipidemia  He is compliant with diabetic diet.   He is very active.  He has lost weight.    Medications and allergies reviewed with patient and updated if appropriate.  Patient Active Problem List   Diagnosis Date Noted  . Retinal vein occlusion 03/29/2020  . Cervical radiculopathy 01/07/2020  . OSA (obstructive sleep apnea), severe 03/14/2019  . Cough 06/15/2018  . Hyperlipidemia 02/27/2018  . Right wrist pain-chronic arthritis 02/24/2018  . Acute pain of left shoulder 02/24/2018  . Diabetes (HCC) 04/04/2016  . Acne rosacea     Current Outpatient Medications on File Prior to Visit  Medication Sig Dispense Refill  . PICATO 0.015 % GEL Take by mouth as needed.     No current facility-administered medications on file prior to visit.    Past Medical History:  Diagnosis Date  . Acne rosacea   . Diverticula, colon    diverticulitis in 08/2010, 11/2014    Past Surgical History:  Procedure Laterality Date  . APPENDECTOMY  12/2009    Social History   Socioeconomic History  . Marital status: Married    Spouse name: Not on file  . Number of children: Not on file  . Years of education: Not on file  . Highest education level: Not on file  Occupational History  . Not on file  Tobacco Use  . Smoking status: Never Smoker  . Smokeless tobacco: Never Used  Substance and Sexual Activity  . Alcohol use: Yes    Alcohol/week: 0.0 standard drinks    Comment: social  . Drug use: No  . Sexual activity: Not on file  Other Topics Concern  . 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 Osteoporosis Mother   . Valvular heart disease Mother   . Coronary artery disease Father   . Valvular heart disease Father   . Hypertension Father   . Cirrhosis Sister 6       EtOH related    Review of Systems  Constitutional: Negative for chills and fever.  Respiratory: Negative for cough, shortness of breath and wheezing.   Cardiovascular: Negative for chest pain, palpitations and leg swelling.  Neurological: Negative for light-headedness and headaches.       Objective:   Vitals:   07/13/20 0927  BP: 116/80  Pulse: 67  Temp: 98 F (36.7 C)  SpO2: 99%   BP Readings from Last 3 Encounters:  07/13/20 116/80  03/30/20 110/72  02/13/20 109/80   Wt Readings from Last 3 Encounters:  07/13/20 174 lb (78.9 kg)  03/30/20 172 lb (78 kg)  01/27/20 185 lb (83.9 kg)   Body mass index is 25.7 kg/m.   Physical Exam    Constitutional: Appears well-developed and well-nourished. No distress.  HENT:  Head: Normocephalic and atraumatic.  Neck: Neck supple. No tracheal deviation present. No thyromegaly present.  No cervical lymphadenopathy Cardiovascular: Normal  rate, regular rhythm and normal heart sounds.   No murmur heard. No carotid bruit .  No edema Pulmonary/Chest: Effort normal and breath sounds normal. No respiratory distress. No has no wheezes. No rales.  Skin: Skin is warm and dry. Not diaphoretic.  Psychiatric: Normal mood and affect. Behavior is normal.      Assessment & Plan:    See Problem List for Assessment and Plan of chronic medical problems.    This visit occurred during the SARS-CoV-2 public health emergency.  Safety protocols were in place, including screening questions prior to the visit, additional usage of staff PPE, and extensive cleaning of exam room while observing appropriate contact time as indicated  for disinfecting solutions.

## 2020-07-13 ENCOUNTER — Encounter: Payer: Self-pay | Admitting: Internal Medicine

## 2020-07-13 ENCOUNTER — Ambulatory Visit: Payer: BC Managed Care – PPO | Admitting: Internal Medicine

## 2020-07-13 ENCOUNTER — Other Ambulatory Visit: Payer: Self-pay

## 2020-07-13 VITALS — BP 116/80 | HR 67 | Temp 98.0°F | Ht 69.0 in | Wt 174.0 lb

## 2020-07-13 DIAGNOSIS — E1169 Type 2 diabetes mellitus with other specified complication: Secondary | ICD-10-CM

## 2020-07-13 DIAGNOSIS — E7849 Other hyperlipidemia: Secondary | ICD-10-CM

## 2020-07-13 LAB — LIPID PANEL
Cholesterol: 219 mg/dL — ABNORMAL HIGH (ref 0–200)
HDL: 44.6 mg/dL (ref >39.00)
LDL Cholesterol: 156 mg/dL — ABNORMAL HIGH (ref 0–99)
NonHDL: 174.82
Total CHOL/HDL Ratio: 5
Triglycerides: 92 mg/dL (ref 0.0–149.0)
VLDL: 18.4 mg/dL (ref 0.0–40.0)

## 2020-07-13 LAB — COMPREHENSIVE METABOLIC PANEL
ALT: 16 U/L (ref 0–53)
AST: 14 U/L (ref 0–37)
Albumin: 4.6 g/dL (ref 3.5–5.2)
Alkaline Phosphatase: 56 U/L (ref 39–117)
BUN: 22 mg/dL (ref 6–23)
CO2: 29 mEq/L (ref 19–32)
Calcium: 9.1 mg/dL (ref 8.4–10.5)
Chloride: 105 mEq/L (ref 96–112)
Creatinine, Ser: 1.08 mg/dL (ref 0.40–1.50)
GFR: 73.58 mL/min (ref >60.00)
Glucose, Bld: 112 mg/dL — ABNORMAL HIGH (ref 70–99)
Potassium: 4.5 mEq/L (ref 3.5–5.1)
Sodium: 141 mEq/L (ref 135–145)
Total Bilirubin: 0.6 mg/dL (ref 0.2–1.2)
Total Protein: 7.5 g/dL (ref 6.0–8.3)

## 2020-07-13 LAB — HEMOGLOBIN A1C: Hgb A1c MFr Bld: 6 % (ref 4.6–6.5)

## 2020-07-13 NOTE — Assessment & Plan Note (Signed)
Chronic Diet controlled Compliant with a diabetic diet and has lost weight Check a1c, cmp, lipids

## 2020-07-13 NOTE — Addendum Note (Signed)
Addended by: Waldemar Dickens B on: 07/13/2020 10:15 AM   Modules accepted: Orders

## 2020-07-13 NOTE — Assessment & Plan Note (Signed)
Chronic Check lipid panel  Diet controlled Would like to avoid medication Regular exercise and healthy diet encouraged

## 2020-07-26 ENCOUNTER — Encounter: Payer: Self-pay | Admitting: Internal Medicine

## 2020-07-27 MED ORDER — CEPHALEXIN 500 MG PO CAPS: 500.0000 mg | ORAL_CAPSULE | Freq: Three times a day (TID) | ORAL | 0 refills | Status: AC

## 2020-11-24 ENCOUNTER — Encounter: Payer: Self-pay | Admitting: Internal Medicine

## 2020-11-24 NOTE — Telephone Encounter (Signed)
No note needed 

## 2021-01-28 ENCOUNTER — Encounter: Payer: BC Managed Care – PPO | Admitting: Internal Medicine

## 2021-02-15 NOTE — Patient Instructions (Addendum)
Blood work was ordered.     Medications changes include :   none    Please followup in 1 year   Health Maintenance, Male Adopting a healthy lifestyle and getting preventive care are important in promoting health and wellness. Ask your health care provider about: The right schedule for you to have regular tests and exams. Things you can do on your own to prevent diseases and keep yourself healthy. What should I know about diet, weight, and exercise? Eat a healthy diet  Eat a diet that includes plenty of vegetables, fruits, low-fat dairy products, and lean protein. Do not eat a lot of foods that are high in solid fats, added sugars, or sodium. Maintain a healthy weight Body mass index (BMI) is a measurement that can be used to identify possible weight problems. It estimates body fat based on height and weight. Your health care provider can help determine your BMI and help you achieve or maintain a healthy weight. Get regular exercise Get regular exercise. This is one of the most important things you can do for your health. Most adults should: Exercise for at least 150 minutes each week. The exercise should increase your heart rate and make you sweat (moderate-intensity exercise). Do strengthening exercises at least twice a week. This is in addition to the moderate-intensity exercise. Spend less time sitting. Even light physical activity can be beneficial. Watch cholesterol and blood lipids Have your blood tested for lipids and cholesterol at 63 years of age, then have this test every 5 years. You may need to have your cholesterol levels checked more often if: Your lipid or cholesterol levels are high. You are older than 63 years of age. You are at high risk for heart disease. What should I know about cancer screening? Many types of cancers can be detected early and may often be prevented. Depending on your health history and family history, you may need to have cancer screening at  various ages. This may include screening for: Colorectal cancer. Prostate cancer. Skin cancer. Lung cancer. What should I know about heart disease, diabetes, and high blood pressure? Blood pressure and heart disease High blood pressure causes heart disease and increases the risk of stroke. This is more likely to develop in people who have high blood pressure readings, are of African descent, or are overweight. Talk with your health care provider about your target blood pressure readings. Have your blood pressure checked: Every 3-5 years if you are 18-39 years of age. Every year if you are 40 years old or older. If you are between the ages of 65 and 75 and are a current or former smoker, ask your health care provider if you should have a one-time screening for abdominal aortic aneurysm (AAA). Diabetes Have regular diabetes screenings. This checks your fasting blood sugar level. Have the screening done: Once every three years after age 45 if you are at a normal weight and have a low risk for diabetes. More often and at a younger age if you are overweight or have a high risk for diabetes. What should I know about preventing infection? Hepatitis B If you have a higher risk for hepatitis B, you should be screened for this virus. Talk with your health care provider to find out if you are at risk for hepatitis B infection. Hepatitis C Blood testing is recommended for: Everyone born from 1945 through 1965. Anyone with known risk factors for hepatitis C. Sexually transmitted infections (STIs) You should be screened each year   for STIs, including gonorrhea and chlamydia, if: You are sexually active and are younger than 63 years of age. You are older than 63 years of age and your health care provider tells you that you are at risk for this type of infection. Your sexual activity has changed since you were last screened, and you are at increased risk for chlamydia or gonorrhea. Ask your health care  provider if you are at risk. Ask your health care provider about whether you are at high risk for HIV. Your health care provider may recommend a prescription medicine to help prevent HIV infection. If you choose to take medicine to prevent HIV, you should first get tested for HIV. You should then be tested every 3 months for as long as you are taking the medicine. Follow these instructions at home: Lifestyle Do not use any products that contain nicotine or tobacco, such as cigarettes, e-cigarettes, and chewing tobacco. If you need help quitting, ask your health care provider. Do not use street drugs. Do not share needles. Ask your health care provider for help if you need support or information about quitting drugs. Alcohol use Do not drink alcohol if your health care provider tells you not to drink. If you drink alcohol: Limit how much you have to 0-2 drinks a day. Be aware of how much alcohol is in your drink. In the [place] one drink equals one 12 oz bottle of beer (355 mL), one 5 oz glass of wine (148 mL), or one 1 oz glass of hard liquor (44 mL). General instructions Schedule regular health, dental, and eye exams. Stay current with your vaccines. Tell your health care provider if: You often feel depressed. You have ever been abused or do not feel safe at home. Summary Adopting a healthy lifestyle and getting preventive care are important in promoting health and wellness. Follow your health care provider's instructions about healthy diet, exercising, and getting tested or screened for diseases. Follow your health care provider's instructions on monitoring your cholesterol and blood pressure. This information is not intended to replace advice given to you by your health care provider. Make sure you discuss any questions you have with your health care provider. Document Revised: 06/19/2020 Document Reviewed: 04/04/2018 Elsevier Patient Education  2022 Elsevier Inc.   

## 2021-02-15 NOTE — Progress Notes (Addendum)
Subjective:    Patient ID: Jack Wood, male    DOB: October 16, 1955, 65 y.o.   MRN: [id number]   This visit occurred during the SARS-CoV-2 public health emergency.  Safety protocols were in place, including screening questions prior to the visit, additional usage of staff PPE, and extensive cleaning of exam room while observing appropriate contact time as indicated for disinfecting solutions.   HPI He is here for a physical exam.   Left upper eye lid infection.  He has had this about a week or so.  He is doing warm compresses and using baby shampoo to clean the area.  It has gotten better.  He does wake up with crust is a high.  He has no other concerns.  Medications and allergies reviewed with patient and updated if appropriate.  Patient Active Problem List   Diagnosis Date Noted   Retinal vein occlusion 03/29/2020   Cervical radiculopathy 01/07/2020   OSA (obstructive sleep apnea), severe 03/14/2019   Cough 06/15/2018   Hyperlipidemia 02/27/2018   Right wrist pain-chronic arthritis 02/24/2018   Acute pain of left shoulder 02/24/2018   Diabetes (HCC) 04/04/2016   Acne rosacea     Current Outpatient Medications on File Prior to Visit  Medication Sig Dispense Refill   PICATO 0.015 % GEL Take by mouth as needed.     No current facility-administered medications on file prior to visit.    Past Medical History:  Diagnosis Date   Acne rosacea    Diverticula, colon    diverticulitis in 08/2010, 11/2014    Past Surgical History:  Procedure Laterality Date   APPENDECTOMY  12/2009    Social History   Socioeconomic History   Marital status: Married    Spouse name: Not on file   Number of children: Not on file   Years of education: Not on file   Highest education level: Not on file  Occupational History   Not on file  Tobacco Use   Smoking status: Never   Smokeless tobacco: Never  Substance and Sexual Activity   Alcohol use: Yes    Alcohol/week: 0.0 standard  drinks    Comment: social   Drug use: No   Sexual activity: Not on file  Other Topics Concern   Not on file  Social History Narrative   Married, lives with wife   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  Eyes:  Negative for visual disturbance.  Respiratory:  Negative for cough, shortness of breath and wheezing.   Cardiovascular:  Negative for chest pain, palpitations and leg swelling.  Gastrointestinal:  Negative for abdominal pain, blood in stool, constipation, diarrhea and nausea.       No gerd  Genitourinary:  Negative for difficulty urinating, dysuria and hematuria.  Musculoskeletal:  Negative for arthralgias and back pain.  Skin:  Negative for rash.  Neurological:  Negative for dizziness, light-headedness, numbness and headaches.  Psychiatric/Behavioral:  Negative for dysphoric mood. The patient is not nervous/anxious.       Objective:   Vitals:   02/16/21 0820  BP: 128/70  Pulse: 78  Temp: 98 F (36.7 C)  SpO2: 98%   Filed Weights   02/16/21 0820  Weight: 177 lb (80.3 kg)   Body mass index is 26.14 kg/m.  BP Readings from Last 3 Encounters:  02/16/21 128/70  07/13/20 116/80  03/30/20 110/72    Wt Readings from Last 3 Encounters:  02/16/21 177 lb (80.3 kg)  07/13/20 174 lb (78.9 kg)  03/30/20 172 lb (78 kg)    Depression screen [facility] 2/9 02/16/2021 11/08/2019 02/23/2018  Decreased Interest 0 0 0  Down, Depressed, Hopeless 0 0 0   PHQ - 2 Score 0 0 0  Altered sleeping 0 - 0  Tired, decreased energy 0 - 0  Change in appetite 1 - 0  Feeling bad or failure about yourself  0 - 0  Trouble concentrating 0 - 0  Moving slowly or fidgety/restless 0 - 0  Suicidal thoughts 0 - 0  PHQ-9 Score 1 - 0  Difficult doing work/chores Not difficult at all - -    GAD 7 : Generalized Anxiety Score 02/16/2021  Nervous, Anxious, on Edge 0  Control/stop worrying 0  Worry too much - different things 0  Trouble relaxing 0  Restless 0  Easily annoyed or irritable 0  Afraid - awful might happen 0  Total GAD 7 Score 0       Physical Exam Constitutional: He appears well-developed and well-nourished. No distress.  HENT:  Head: Normocephalic and atraumatic.  Right Ear: External ear normal.  Left Ear: External ear normal.  Mouth/Throat: Oropharynx is clear and moist.  Normal ear canals and TM b/l  Eyes: Conjunctivae and EOM are normal.  Neck: Neck supple. No tracheal deviation present. No thyromegaly present.  No carotid bruit  Cardiovascular: Normal rate, regular rhythm, normal heart sounds and intact distal pulses.   No murmur heard. Pulmonary/Chest: Effort normal and breath sounds normal. No respiratory distress. He has no wheezes. He has no rales.  Abdominal: Soft. He exhibits no distension. There is no tenderness.  Genitourinary: deferred  Musculoskeletal: He exhibits no edema.  Lymphadenopathy:   He has no cervical adenopathy.  Skin: Skin is warm and dry. He is not diaphoretic.  Psychiatric: He has a normal mood and affect. His behavior is normal.         Assessment & Plan:   Physical exam: Screening blood work  ordered Exercise   active, no regimented exercise Weight  ok for age Substance abuse   none Sees derm annually  Reviewed recommended immunizations.  He will schedule a colonoscopy  Screened for depression using the PHQ 9 scale.  No evidence of depression.   Screened for anxiety using GAD7 Scale.   No evidence of anxiety.    Health Maintenance  Topic Date Due   COVID-19 Vaccine (1) Never done   Pneumococcal Vaccine 50-55 Years old (1 - PCV) Never done   FOOT EXAM  Never done   OPHTHALMOLOGY EXAM  Never done   COLONOSCOPY (Pts 45-58yrs Insurance coverage will need to be confirmed)  08/26/2020   Zoster Vaccines- Shingrix (1 of 2) 05/19/2021 (Originally 02/04/2008)   TETANUS/TDAP  02/16/2022 (Originally 01/24/2019)   HEMOGLOBIN A1C  08/17/2021   URINE MICROALBUMIN  02/16/2022   INFLUENZA VACCINE  Completed  Hepatitis C Screening  Completed   HIV Screening  Completed   HPV VACCINES  Aged Out     See Problem List for Assessment and Plan of chronic medical problems.

## 2021-02-16 ENCOUNTER — Ambulatory Visit (INDEPENDENT_AMBULATORY_CARE_PROVIDER_SITE_OTHER): Payer: BC Managed Care – PPO | Admitting: Internal Medicine

## 2021-02-16 ENCOUNTER — Other Ambulatory Visit: Payer: Self-pay

## 2021-02-16 ENCOUNTER — Encounter: Payer: Self-pay | Admitting: Internal Medicine

## 2021-02-16 VITALS — BP 128/70 | HR 78 | Temp 98.0°F | Ht 69.0 in | Wt 177.0 lb

## 2021-02-16 DIAGNOSIS — E1169 Type 2 diabetes mellitus with other specified complication: Secondary | ICD-10-CM

## 2021-02-16 DIAGNOSIS — Z125 Encounter for screening for malignant neoplasm of prostate: Secondary | ICD-10-CM

## 2021-02-16 DIAGNOSIS — E7849 Other hyperlipidemia: Secondary | ICD-10-CM | POA: Diagnosis not present

## 2021-02-16 DIAGNOSIS — Z0000 Encounter for general adult medical examination without abnormal findings: Secondary | ICD-10-CM

## 2021-02-16 DIAGNOSIS — Z1331 Encounter for screening for depression: Secondary | ICD-10-CM | POA: Diagnosis not present

## 2021-02-16 DIAGNOSIS — L719 Rosacea, unspecified: Secondary | ICD-10-CM

## 2021-02-16 LAB — LIPID PANEL
Cholesterol: 229 mg/dL — ABNORMAL HIGH (ref 0–200)
HDL: 45.5 mg/dL (ref >39.00)
LDL Cholesterol: 153 mg/dL — ABNORMAL HIGH (ref 0–99)
NonHDL: 183.88
Total CHOL/HDL Ratio: 5
Triglycerides: 154 mg/dL — ABNORMAL HIGH (ref 0.0–149.0)
VLDL: 30.8 mg/dL (ref 0.0–40.0)

## 2021-02-16 LAB — MICROALBUMIN / CREATININE URINE RATIO
Creatinine,U: 163.1 mg/dL
Microalb Creat Ratio: 0.8 mg/g (ref 0.0–30.0)
Microalb, Ur: 1.3 mg/dL (ref 0.0–1.9)

## 2021-02-16 LAB — COMPREHENSIVE METABOLIC PANEL
ALT: 17 U/L (ref 0–53)
AST: 16 U/L (ref 0–37)
Albumin: 4.6 g/dL (ref 3.5–5.2)
Alkaline Phosphatase: 51 U/L (ref 39–117)
BUN: 21 mg/dL (ref 6–23)
CO2: 26 mEq/L (ref 19–32)
Calcium: 9.1 mg/dL (ref 8.4–10.5)
Chloride: 103 mEq/L (ref 96–112)
Creatinine, Ser: 0.99 mg/dL (ref 0.40–1.50)
GFR: 81.34 mL/min (ref >60.00)
Glucose, Bld: 125 mg/dL — ABNORMAL HIGH (ref 70–99)
Potassium: 4.2 mEq/L (ref 3.5–5.1)
Sodium: 137 mEq/L (ref 135–145)
Total Bilirubin: 0.8 mg/dL (ref 0.2–1.2)
Total Protein: 7.7 g/dL (ref 6.0–8.3)

## 2021-02-16 LAB — CBC WITH DIFFERENTIAL/PLATELET
Basophils Absolute: 0.1 10*3/uL (ref 0.0–0.1)
Basophils Relative: 1 % (ref 0.0–3.0)
Eosinophils Absolute: 0.3 10*3/uL (ref 0.0–0.7)
Eosinophils Relative: 4.6 % (ref 0.0–5.0)
HCT: 42.9 % (ref 39.0–52.0)
Hemoglobin: 14.7 g/dL (ref 13.0–17.0)
Lymphocytes Relative: 29.3 % (ref 12.0–46.0)
Lymphs Abs: 2 10*3/uL (ref 0.7–4.0)
MCHC: 34.2 g/dL (ref 30.0–36.0)
MCV: 87.6 fl (ref 78.0–100.0)
Monocytes Absolute: 0.7 10*3/uL (ref 0.1–1.0)
Monocytes Relative: 10.8 % (ref 3.0–12.0)
Neutro Abs: 3.7 10*3/uL (ref 1.4–7.7)
Neutrophils Relative %: 54.3 % (ref 43.0–77.0)
Platelets: 227 10*3/uL (ref 150.0–400.0)
RBC: 4.9 Mil/uL (ref 4.22–5.81)
RDW: 13.4 % (ref 11.5–15.5)
WBC: 6.8 10*3/uL (ref 4.0–10.5)

## 2021-02-16 LAB — HEMOGLOBIN A1C: Hgb A1c MFr Bld: 6.1 % (ref 4.6–6.5)

## 2021-02-16 LAB — TSH: TSH: 2.07 u[IU]/mL (ref 0.35–5.50)

## 2021-02-16 NOTE — Assessment & Plan Note (Signed)
Chronic Diet controlled Check A1c, urine microalbumin Continue diabetic diet, being active and keeping weight down

## 2021-02-16 NOTE — Assessment & Plan Note (Signed)
Chronic Regular exercise and healthy diet encouraged Check lipid panel  Continue lifestyle control-he would like to avoid medication Discussed that we can consider a coronary artery calcification score to help clarify if he needs the statin or not

## 2021-02-16 NOTE — Assessment & Plan Note (Signed)
Chronic Follows with dermatology-on Picato gel as needed

## 2021-02-17 LAB — PSA, TOTAL AND FREE
PSA, % Free: 45 % (calc) (ref >25)
PSA, Free: 0.5 ng/mL
PSA, Total: 1.1 ng/mL (ref <=4.0)

## 2021-05-17 ENCOUNTER — Encounter: Payer: Self-pay | Admitting: Internal Medicine

## 2021-07-22 LAB — HM DIABETES EYE EXAM

## 2022-01-14 IMAGING — DX DG CERVICAL SPINE COMPLETE 4+V
5 series · 5 of 5 positions shown · non-contrast
Comparison: None.

CLINICAL DATA: 63-year-old male with neck pain.

EXAM:
CERVICAL SPINE - COMPLETE 4+ VIEW

[c-spine lat]
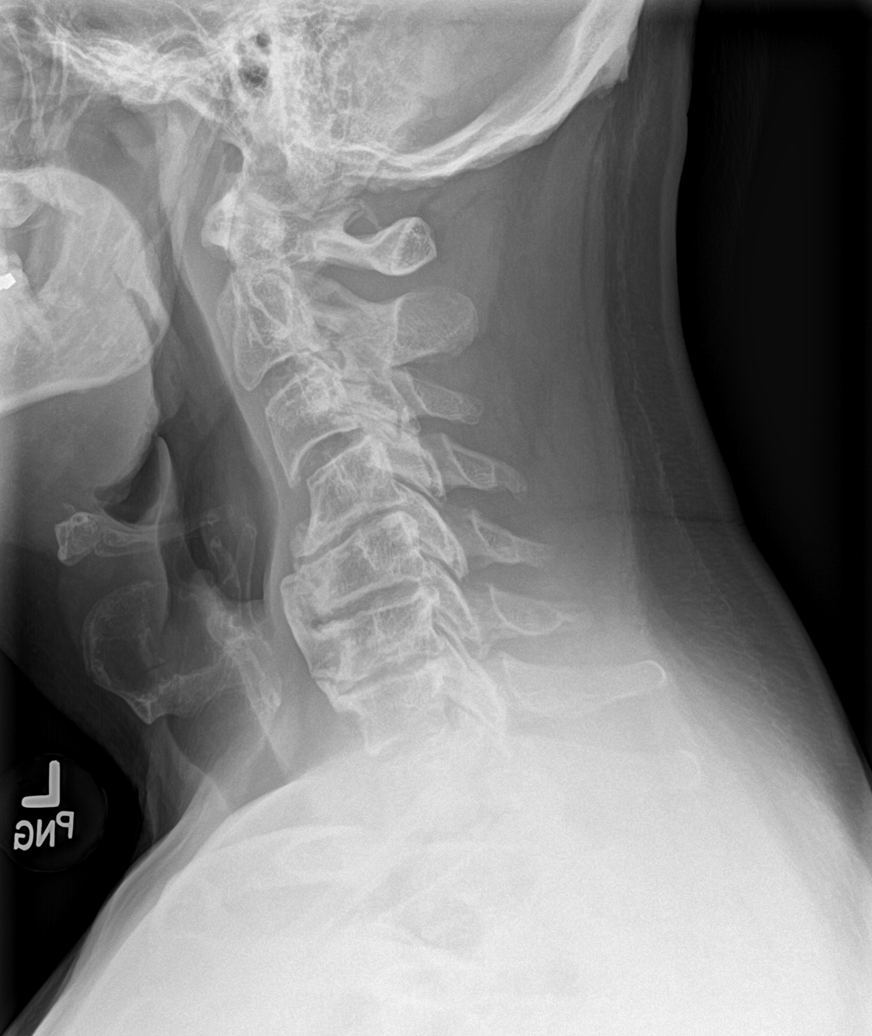

[c-spine obl (1 of 2)]
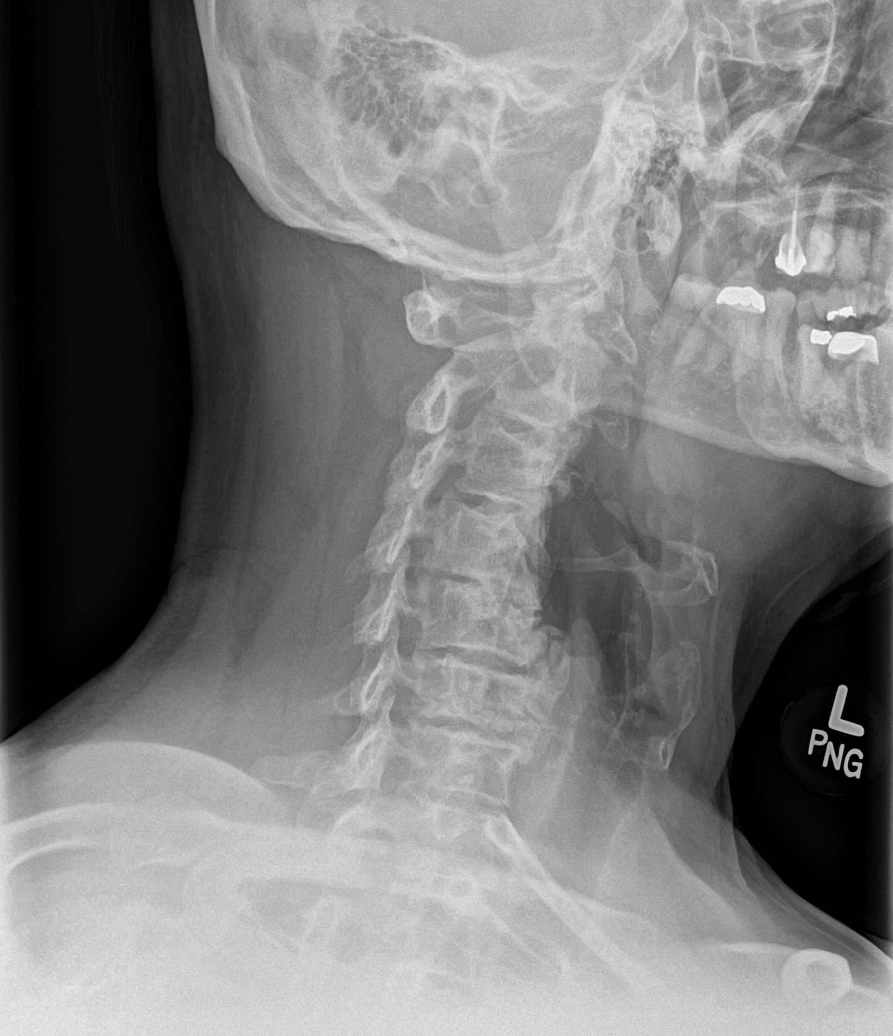

[c-spine obl (2 of 2)]
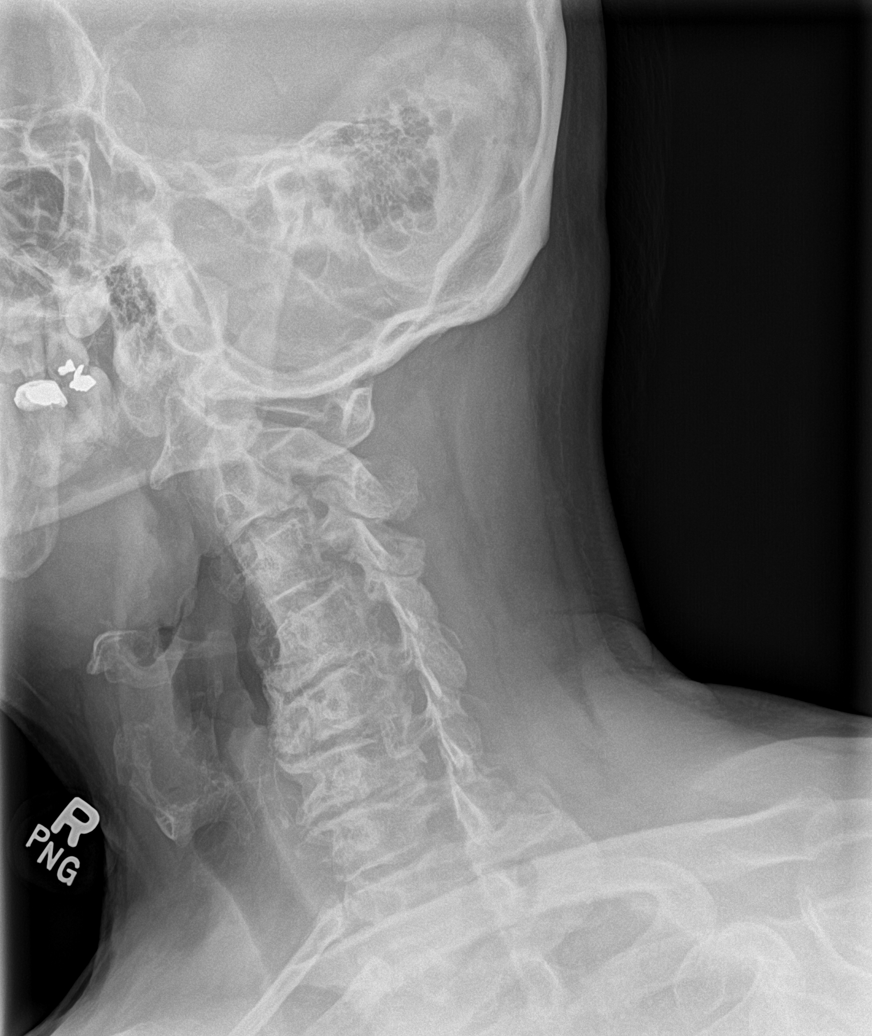

[c-spine ap]
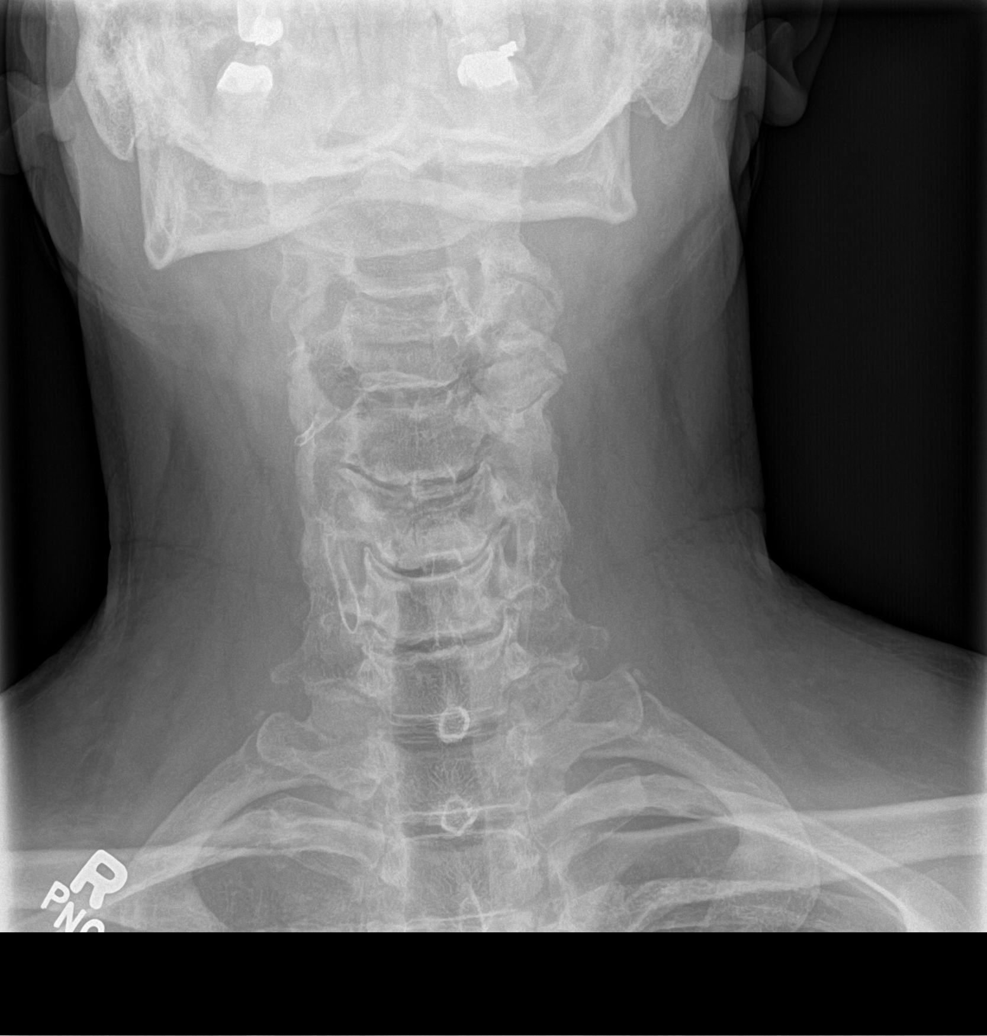

[c-spine open mouth]
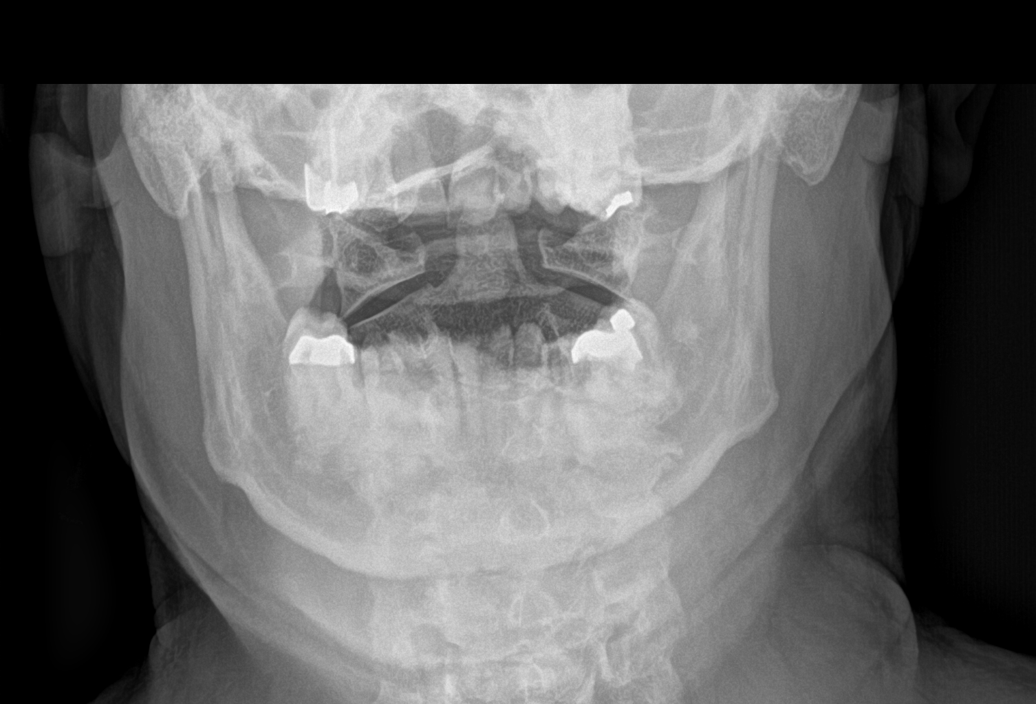

[5 of 5 positions shown; findings below may reference images not displayed]

FINDINGS: There is no acute fracture or subluxation of the cervical spine.
There is straightening of normal cervical lordosis which may be
positional or due to muscle spasm or degenerative changes. There is
grade 1 C3-C4 anterolisthesis. Degenerative changes with disc space
narrowing and endplate irregularity and osteophyte primarily at
C4-C7. The visualized posterior elements and odontoid appear intact.
Mild bilateral neural foramina narrowing primarily involving C4-C5
on the right and C3-C4 on the left. There is anatomic alignment of
the lateral masses of C1 and C2. The soft tissues are unremarkable.
IMPRESSION: 1. No acute fracture or subluxation.
2. Degenerative changes of the cervical spine most prominent at
C4-C7.

## 2022-02-22 ENCOUNTER — Encounter: Payer: BC Managed Care – PPO | Admitting: Internal Medicine

## 2022-03-27 ENCOUNTER — Encounter: Payer: Self-pay | Admitting: Internal Medicine

## 2022-03-27 NOTE — Progress Notes (Unsigned)
Subjective:    Patient ID: Jack Wood, male    DOB: 03-May-1955, 66 y.o.   MRN: [id number]     HPI Jack Wood is here for a physical exam.   Doing well - no concerns.    Medications and allergies reviewed with patient and updated if appropriate.  Current Outpatient Medications on File Prior to Visit  Medication Sig Dispense Refill   diclofenac Sodium (VOLTAREN) 1 % GEL Apply topically 4 (four) times daily.     PICATO 0.015 % GEL Take by mouth as needed.     No current facility-administered medications on file prior to visit.    Review of Systems  Constitutional:  Negative for chills and fever.  Eyes:  Negative for visual disturbance.  Respiratory:  Negative for cough, shortness of breath and wheezing.   Cardiovascular:  Negative for chest pain, palpitations and leg swelling.  Gastrointestinal:  Negative for abdominal pain, blood in stool, constipation, diarrhea and nausea.       No gerd  Genitourinary:  Negative for difficulty urinating, dysuria and hematuria.  Musculoskeletal:  Positive for arthralgias (right wrist). Negative for back pain.  Skin:  Negative for rash.  Neurological:  Negative for light-headedness and headaches.  Psychiatric/Behavioral:  Negative for dysphoric mood. The patient is not nervous/anxious.        Objective:   Vitals:   03/29/22 0809  BP: 114/72  Pulse: 80  Temp: 98.1 F (36.7 C)  SpO2: 96%   Filed Weights   03/29/22 0809  Weight: 175 lb (79.4 kg)   Body mass index is 25.84 kg/m.  BP Readings from Last 3 Encounters:  03/29/22 114/72  02/16/21 128/70  07/13/20 116/80    Wt Readings from Last 3 Encounters:  03/29/22 175 lb (79.4 kg)  02/16/21 177 lb (80.3 kg)  07/13/20 174 lb (78.9 kg)      Physical Exam Constitutional: He appears well-developed and well-nourished. No distress.  HENT:  Head: Normocephalic and atraumatic.  Right Ear: External ear normal.  Left Ear: External ear normal.  Mouth/Throat: Oropharynx  is clear and moist.  Normal ear canals and TM b/l  Eyes: Conjunctivae and EOM are normal.  Neck: Neck supple. No tracheal deviation present. No thyromegaly present.  No carotid bruit  Cardiovascular: Normal rate, regular rhythm, normal heart sounds and intact distal pulses.   No murmur heard. Pulmonary/Chest: Effort normal and breath sounds normal. No respiratory distress. He has no wheezes. He has no rales.  Abdominal: Soft. He exhibits no distension. There is no tenderness.  Genitourinary: deferred  Musculoskeletal: He exhibits no edema.  Lymphadenopathy:   He has no cervical adenopathy.  Skin: Skin is warm and dry. He is not diaphoretic.  Psychiatric: He has a normal mood and affect. His behavior is normal.    The 10-year ASCVD risk score (Arnett DK, et al., 2019) is: 20.8%   Values used to calculate the score:     Age: 78 years     Sex: Male     Is Non-Hispanic African American: No     Diabetic: Yes     Tobacco smoker: No     Systolic Blood Pressure: 114 mmHg     Is BP treated: No     HDL Cholesterol: 45.5 mg/dL     Total Cholesterol: 229 mg/dL      Assessment & Plan:   Physical exam: Screening blood work  ordered Exercise   active, no regimented exercise Weight  normal -- has lost weight  over the past few months Substance abuse   none   Reviewed recommended immunizations.   Health Maintenance  Topic Date Due   FOOT EXAM  Never done   Zoster Vaccines- Shingrix (1 of 2) Never done   DTaP/Tdap/Td (2 - Td or Tdap) 01/24/2019   COLONOSCOPY (Pts 45-18yrs Insurance coverage will need to be confirmed)  08/26/2020   HEMOGLOBIN A1C  08/17/2021   Diabetic kidney evaluation - GFR measurement  02/16/2022   Diabetic kidney evaluation - Urine ACR  02/16/2022   COVID-19 Vaccine (2 - 2023-24 season) 04/14/2022 (Originally 02/27/2022)   OPHTHALMOLOGY EXAM  07/23/2022   INFLUENZA VACCINE  Completed   Hepatitis C Screening  Completed   HIV Screening  Completed   HPV VACCINES   Aged Out   Colonoscopy up-to-date-will get report  See Problem List for Assessment and Plan of chronic medical problems.

## 2022-03-27 NOTE — Patient Instructions (Addendum)
Blood work was ordered.   The lab is on the first floor.    Medications changes include :   None    Return in about 1 year (around 03/30/2023) for Physical Exam.   Health Maintenance, Male Adopting a healthy lifestyle and getting preventive care are important in promoting health and wellness. Ask your health care provider about: The right schedule for you to have regular tests and exams. Things you can do on your own to prevent diseases and keep yourself healthy. What should I know about diet, weight, and exercise? Eat a healthy diet  Eat a diet that includes plenty of vegetables, fruits, low-fat dairy products, and lean protein. Do not eat a lot of foods that are high in solid fats, added sugars, or sodium. Maintain a healthy weight Body mass index (BMI) is a measurement that can be used to identify possible weight problems. It estimates body fat based on height and weight. Your health care provider can help determine your BMI and help you achieve or maintain a healthy weight. Get regular exercise Get regular exercise. This is one of the most important things you can do for your health. Most adults should: Exercise for at least 150 minutes each week. The exercise should increase your heart rate and make you sweat (moderate-intensity exercise). Do strengthening exercises at least twice a week. This is in addition to the moderate-intensity exercise. Spend less time sitting. Even light physical activity can be beneficial. Watch cholesterol and blood lipids Have your blood tested for lipids and cholesterol at 64 years of age, then have this test every 5 years. You may need to have your cholesterol levels checked more often if: Your lipid or cholesterol levels are high. You are older than 64 years of age. You are at high risk for heart disease. What should I know about cancer screening? Many types of cancers can be detected early and may often be prevented. Depending on your  health history and family history, you may need to have cancer screening at various ages. This may include screening for: Colorectal cancer. Prostate cancer. Skin cancer. Lung cancer. What should I know about heart disease, diabetes, and high blood pressure? Blood pressure and heart disease High blood pressure causes heart disease and increases the risk of stroke. This is more likely to develop in people who have high blood pressure readings or are overweight. Talk with your health care provider about your target blood pressure readings. Have your blood pressure checked: Every 3-5 years if you are 89-91 years of age. Every year if you are 42 years old or older. If you are between the ages of 38 and 72 and are a current or former smoker, ask your health care provider if you should have a one-time screening for abdominal aortic aneurysm (AAA). Diabetes Have regular diabetes screenings. This checks your fasting blood sugar level. Have the screening done: Once every three years after age 16 if you are at a normal weight and have a low risk for diabetes. More often and at a younger age if you are overweight or have a high risk for diabetes. What should I know about preventing infection? Hepatitis B If you have a higher risk for hepatitis B, you should be screened for this virus. Talk with your health care provider to find out if you are at risk for hepatitis B infection. Hepatitis C Blood testing is recommended for: Everyone born from [birth year] through 1965. Anyone with known risk factors  for hepatitis C. Sexually transmitted infections (STIs) You should be screened each year for STIs, including gonorrhea and chlamydia, if: You are sexually active and are younger than 64 years of age. You are older than 64 years of age and your health care provider tells you that you are at risk for this type of infection. Your sexual activity has changed since you were last screened, and you are at increased risk  for chlamydia or gonorrhea. Ask your health care provider if you are at risk. Ask your health care provider about whether you are at high risk for HIV. Your health care provider may recommend a prescription medicine to help prevent HIV infection. If you choose to take medicine to prevent HIV, you should first get tested for HIV. You should then be tested every 3 months for as long as you are taking the medicine. Follow these instructions at home: Alcohol use Do not drink alcohol if your health care provider tells you not to drink. If you drink alcohol: Limit how much you have to 0-2 drinks a day. Know how much alcohol is in your drink. In the [place] one drink equals one 12 oz bottle of beer (355 mL), one 5 oz glass of wine (148 mL), or one 1 oz glass of hard liquor (44 mL). Lifestyle Do not use any products that contain nicotine or tobacco. These products include cigarettes, chewing tobacco, and vaping devices, such as e-cigarettes. If you need help quitting, ask your health care provider. Do not use street drugs. Do not share needles. Ask your health care provider for help if you need support or information about quitting drugs. General instructions Schedule regular health, dental, and eye exams. Stay current with your vaccines. Tell your health care provider if: You often feel depressed. You have ever been abused or do not feel safe at home. Summary Adopting a healthy lifestyle and getting preventive care are important in promoting health and wellness. Follow your health care provider's instructions about healthy diet, exercising, and getting tested or screened for diseases. Follow your health care provider's instructions on monitoring your cholesterol and blood pressure. This information is not intended to replace advice given to you by your health care provider. Make sure you discuss any questions you have with your health care provider. Document Revised: 08/31/2020 Document Reviewed:  08/31/2020 Elsevier Patient Education  [employer].

## 2022-03-29 ENCOUNTER — Ambulatory Visit (INDEPENDENT_AMBULATORY_CARE_PROVIDER_SITE_OTHER): Payer: BC Managed Care – PPO | Admitting: Internal Medicine

## 2022-03-29 VITALS — BP 114/72 | HR 80 | Temp 98.1°F | Ht 69.0 in | Wt 175.0 lb

## 2022-03-29 DIAGNOSIS — E7849 Other hyperlipidemia: Secondary | ICD-10-CM

## 2022-03-29 DIAGNOSIS — Z125 Encounter for screening for malignant neoplasm of prostate: Secondary | ICD-10-CM | POA: Diagnosis not present

## 2022-03-29 DIAGNOSIS — E1169 Type 2 diabetes mellitus with other specified complication: Secondary | ICD-10-CM

## 2022-03-29 DIAGNOSIS — Z0000 Encounter for general adult medical examination without abnormal findings: Secondary | ICD-10-CM | POA: Diagnosis not present

## 2022-03-29 LAB — TSH: TSH: 1.87 u[IU]/mL (ref 0.35–5.50)

## 2022-03-29 LAB — HEMOGLOBIN A1C: Hgb A1c MFr Bld: 6.4 % (ref 4.6–6.5)

## 2022-03-29 LAB — CBC WITH DIFFERENTIAL/PLATELET
Basophils Absolute: 0.1 10*3/uL (ref 0.0–0.1)
Basophils Relative: 0.8 % (ref 0.0–3.0)
Eosinophils Absolute: 0.2 10*3/uL (ref 0.0–0.7)
Eosinophils Relative: 3.2 % (ref 0.0–5.0)
HCT: 43.5 % (ref 39.0–52.0)
Hemoglobin: 15.1 g/dL (ref 13.0–17.0)
Lymphocytes Relative: 32.6 % (ref 12.0–46.0)
Lymphs Abs: 2.2 10*3/uL (ref 0.7–4.0)
MCHC: 34.8 g/dL (ref 30.0–36.0)
MCV: 86.9 fl (ref 78.0–100.0)
Monocytes Absolute: 0.8 10*3/uL (ref 0.1–1.0)
Monocytes Relative: 12 % (ref 3.0–12.0)
Neutro Abs: 3.5 10*3/uL (ref 1.4–7.7)
Neutrophils Relative %: 51.4 % (ref 43.0–77.0)
Platelets: 254 10*3/uL (ref 150.0–400.0)
RBC: 5 Mil/uL (ref 4.22–5.81)
RDW: 13.3 % (ref 11.5–15.5)
WBC: 6.8 10*3/uL (ref 4.0–10.5)

## 2022-03-29 LAB — COMPREHENSIVE METABOLIC PANEL
ALT: 17 U/L (ref 0–53)
AST: 14 U/L (ref 0–37)
Albumin: 4.8 g/dL (ref 3.5–5.2)
Alkaline Phosphatase: 54 U/L (ref 39–117)
BUN: 16 mg/dL (ref 6–23)
CO2: 29 mEq/L (ref 19–32)
Calcium: 9 mg/dL (ref 8.4–10.5)
Chloride: 103 mEq/L (ref 96–112)
Creatinine, Ser: 1.12 mg/dL (ref 0.40–1.50)
GFR: 69.6 mL/min (ref >60.00)
Glucose, Bld: 112 mg/dL — ABNORMAL HIGH (ref 70–99)
Potassium: 4.4 mEq/L (ref 3.5–5.1)
Sodium: 140 mEq/L (ref 135–145)
Total Bilirubin: 0.7 mg/dL (ref 0.2–1.2)
Total Protein: 7.6 g/dL (ref 6.0–8.3)

## 2022-03-29 LAB — PSA: PSA: 1.19 ng/mL (ref 0.10–4.00)

## 2022-03-29 LAB — MICROALBUMIN / CREATININE URINE RATIO
Creatinine,U: 141 mg/dL
Microalb Creat Ratio: 0.6 mg/g (ref 0.0–30.0)
Microalb, Ur: 0.9 mg/dL (ref 0.0–1.9)

## 2022-03-29 LAB — LIPID PANEL
Cholesterol: 217 mg/dL — ABNORMAL HIGH (ref 0–200)
HDL: 45.1 mg/dL (ref >39.00)
LDL Cholesterol: 148 mg/dL — ABNORMAL HIGH (ref 0–99)
NonHDL: 172.02
Total CHOL/HDL Ratio: 5
Triglycerides: 120 mg/dL (ref 0.0–149.0)
VLDL: 24 mg/dL (ref 0.0–40.0)

## 2022-03-29 MED ORDER — DICLOFENAC SODIUM 1 % EX GEL: 4.0000 g | Freq: Four times a day (QID) | CUTANEOUS | 5 refills | Status: AC

## 2022-03-29 NOTE — Assessment & Plan Note (Addendum)
Chronic Regular exercise and healthy diet encouraged Check lipid panel, CMP, TSH Continue lifestyle control - does not want to take medication  Can consider CT coronary calcium score to evaluate risk

## 2022-03-29 NOTE — Assessment & Plan Note (Signed)
Chronic   Lab Results  Component Value Date   HGBA1C 6.1 02/16/2021   Sugars well controlled Check A1c, urine microalbumin today Continue diet control Stressed regular exercise, diabetic diet

## 2022-07-04 ENCOUNTER — Encounter: Payer: Self-pay | Admitting: Internal Medicine

## 2023-03-30 NOTE — Progress Notes (Signed)
Subjective:    Patient ID: Jack Wood, male    DOB: [birth year], 67 y.o.   MRN: [id number]     HPI Jack Wood is here for a physical exam and his chronic medical problems.   No concerns.  He has not been eating as healthy recently and has not been as active.   Medications and allergies reviewed with patient and updated if appropriate.  Current Outpatient Medications on File Prior to Visit  Medication Sig Dispense Refill   diclofenac Sodium (VOLTAREN) 1 % GEL Apply 4 g topically 4 (four) times daily. 150 g 5   No current facility-administered medications on file prior to visit.    Review of Systems  Constitutional:  Negative for fever.  Eyes:  Negative for visual disturbance.  Respiratory:  Negative for cough, shortness of breath and wheezing.   Cardiovascular:  Negative for chest pain, palpitations and leg swelling.  Gastrointestinal:  Positive for anal bleeding (hemorrhoidal). Negative for abdominal pain, blood in stool, constipation and diarrhea.       No gerd  Genitourinary:  Negative for difficulty urinating, dysuria and hematuria.  Musculoskeletal:  Positive for arthralgias (right wrist). Negative for back pain.  Skin:  Negative for rash.  Neurological:  Negative for light-headedness and headaches.  Psychiatric/Behavioral:  Negative for dysphoric mood. The patient is not nervous/anxious.        Objective:   Vitals:   03/31/23 0753  BP: 122/80  Pulse: 80  Temp: 98.4 F (36.9 C)  SpO2: 95%   Filed Weights   03/31/23 0753  Weight: 187 lb (84.8 kg)   Body mass index is 27.62 kg/m.  BP Readings from Last 3 Encounters:  03/31/23 122/80  03/29/22 114/72  02/16/21 128/70    Wt Readings from Last 3 Encounters:  03/31/23 187 lb (84.8 kg)  03/29/22 175 lb (79.4 kg)  02/16/21 177 lb (80.3 kg)      Physical Exam Constitutional: He appears well-developed and well-nourished. No distress.  HENT:  Head: Normocephalic and atraumatic.  Right Ear:  External ear normal.  Left Ear: External ear normal.  Normal ear canals and TM b/l  Mouth/Throat: Oropharynx is clear and moist. Eyes: Conjunctivae and EOM are normal.  Neck: Neck supple. No tracheal deviation present. No thyromegaly present.  No carotid bruit  Cardiovascular: Normal rate, regular rhythm, normal heart sounds and intact distal pulses.   No murmur heard.  No lower extremity edema. Pulmonary/Chest: Effort normal and breath sounds normal. No respiratory distress. He has no wheezes. He has no rales.  Abdominal: Soft. He exhibits no distension. There is no tenderness.  Genitourinary: deferred  Lymphadenopathy:   He has no cervical adenopathy.  Skin: Skin is warm and dry. He is not diaphoretic.  Psychiatric: He has a normal mood and affect. His behavior is normal.         Assessment & Plan:   Physical exam: Screening blood work  ordered Exercise   not regular Weight  overweight Substance abuse   none   Reviewed recommended immunizations.   Health Maintenance  Topic Date Due   Medicare Annual Wellness (AWV)  Never done   FOOT EXAM  Never done   Zoster Vaccines- Shingrix (1 of 2) Never done   Colonoscopy  08/26/2020   OPHTHALMOLOGY EXAM  07/23/2022   HEMOGLOBIN A1C  09/28/2022   INFLUENZA VACCINE  11/24/2022   COVID-19 Vaccine (2 - 2023-24 season) 12/25/2022   Pneumonia Vaccine 79+ Years old (1 of 1 - PCV)  Never done   Diabetic kidney evaluation - eGFR measurement  03/30/2023   Diabetic kidney evaluation - Urine ACR  03/30/2023   DTaP/Tdap/Td (3 - Td or Tdap) 06/09/2031   Hepatitis C Screening  Completed   HIV Screening  Completed   HPV VACCINES  Aged Out   Colonoscopy up-to-date-we will get the report  See Problem List for Assessment and Plan of chronic medical problems.

## 2023-03-30 NOTE — Patient Instructions (Addendum)
Blood work was ordered.       Medications changes include :   None     Return in about 1 year (around 03/30/2024) for Physical Exam.   Health Maintenance, Male Adopting a healthy lifestyle and getting preventive care are important in promoting health and wellness. Ask your health care provider about: The right schedule for you to have regular tests and exams. Things you can do on your own to prevent diseases and keep yourself healthy. What should I know about diet, weight, and exercise? Eat a healthy diet  Eat a diet that includes plenty of vegetables, fruits, low-fat dairy products, and lean protein. Do not eat a lot of foods that are high in solid fats, added sugars, or sodium. Maintain a healthy weight Body mass index (BMI) is a measurement that can be used to identify possible weight problems. It estimates body fat based on height and weight. Your health care provider can help determine your BMI and help you achieve or maintain a healthy weight. Get regular exercise Get regular exercise. This is one of the most important things you can do for your health. Most adults should: Exercise for at least 150 minutes each week. The exercise should increase your heart rate and make you sweat (moderate-intensity exercise). Do strengthening exercises at least twice a week. This is in addition to the moderate-intensity exercise. Spend less time sitting. Even light physical activity can be beneficial. Watch cholesterol and blood lipids Have your blood tested for lipids and cholesterol at 65 years of age, then have this test every 5 years. You may need to have your cholesterol levels checked more often if: Your lipid or cholesterol levels are high. You are older than 65 years of age. You are at high risk for heart disease. What should I know about cancer screening? Many types of cancers can be detected early and may often be prevented. Depending on your health history and family  history, you may need to have cancer screening at various ages. This may include screening for: Colorectal cancer. Prostate cancer. Skin cancer. Lung cancer. What should I know about heart disease, diabetes, and high blood pressure? Blood pressure and heart disease High blood pressure causes heart disease and increases the risk of stroke. This is more likely to develop in people who have high blood pressure readings or are overweight. Talk with your health care provider about your target blood pressure readings. Have your blood pressure checked: Every 3-5 years if you are 9-64 years of age. Every year if you are 61 years old or older. If you are between the ages of 7 and 33 and are a current or former smoker, ask your health care provider if you should have a one-time screening for abdominal aortic aneurysm (AAA). Diabetes Have regular diabetes screenings. This checks your fasting blood sugar level. Have the screening done: Once every three years after age 23 if you are at a normal weight and have a low risk for diabetes. More often and at a younger age if you are overweight or have a high risk for diabetes. What should I know about preventing infection? Hepatitis B If you have a higher risk for hepatitis B, you should be screened for this virus. Talk with your health care provider to find out if you are at risk for hepatitis B infection. Hepatitis C Blood testing is recommended for: Everyone born from [birth year] through 1963. Anyone with known risk factors for hepatitis C. Sexually transmitted  infections (STIs) You should be screened each year for STIs, including gonorrhea and chlamydia, if: You are sexually active and are younger than 65 years of age. You are older than 65 years of age and your health care provider tells you that you are at risk for this type of infection. Your sexual activity has changed since you were last screened, and you are at increased risk for chlamydia or  gonorrhea. Ask your health care provider if you are at risk. Ask your health care provider about whether you are at high risk for HIV. Your health care provider may recommend a prescription medicine to help prevent HIV infection. If you choose to take medicine to prevent HIV, you should first get tested for HIV. You should then be tested every 3 months for as long as you are taking the medicine. Follow these instructions at home: Alcohol use Do not drink alcohol if your health care provider tells you not to drink. If you drink alcohol: Limit how much you have to 0-2 drinks a day. Know how much alcohol is in your drink. In the [place] one drink equals one 12 oz bottle of beer (355 mL), one 5 oz glass of wine (148 mL), or one 1 oz glass of hard liquor (44 mL). Lifestyle Do not use any products that contain nicotine or tobacco. These products include cigarettes, chewing tobacco, and vaping devices, such as e-cigarettes. If you need help quitting, ask your health care provider. Do not use street drugs. Do not share needles. Ask your health care provider for help if you need support or information about quitting drugs. General instructions Schedule regular health, dental, and eye exams. Stay current with your vaccines. Tell your health care provider if: You often feel depressed. You have ever been abused or do not feel safe at home. Summary Adopting a healthy lifestyle and getting preventive care are important in promoting health and wellness. Follow your health care provider's instructions about healthy diet, exercising, and getting tested or screened for diseases. Follow your health care provider's instructions on monitoring your cholesterol and blood pressure. This information is not intended to replace advice given to you by your health care provider. Make sure you discuss any questions you have with your health care provider. Document Revised: 08/31/2020 Document Reviewed: 08/31/2020 Elsevier  Patient Education  2024 [employer].

## 2023-03-31 ENCOUNTER — Ambulatory Visit (INDEPENDENT_AMBULATORY_CARE_PROVIDER_SITE_OTHER): Payer: Medicare PPO | Admitting: Internal Medicine

## 2023-03-31 ENCOUNTER — Encounter: Payer: Self-pay | Admitting: Internal Medicine

## 2023-03-31 VITALS — BP 122/80 | HR 80 | Temp 98.4°F | Ht 69.0 in | Wt 187.0 lb

## 2023-03-31 DIAGNOSIS — G4733 Obstructive sleep apnea (adult) (pediatric): Secondary | ICD-10-CM | POA: Diagnosis not present

## 2023-03-31 DIAGNOSIS — Z0000 Encounter for general adult medical examination without abnormal findings: Secondary | ICD-10-CM | POA: Diagnosis not present

## 2023-03-31 DIAGNOSIS — Z125 Encounter for screening for malignant neoplasm of prostate: Secondary | ICD-10-CM

## 2023-03-31 DIAGNOSIS — E7849 Other hyperlipidemia: Secondary | ICD-10-CM | POA: Diagnosis not present

## 2023-03-31 DIAGNOSIS — E1169 Type 2 diabetes mellitus with other specified complication: Secondary | ICD-10-CM | POA: Diagnosis not present

## 2023-03-31 LAB — CBC WITH DIFFERENTIAL/PLATELET
Basophils Absolute: 0.1 10*3/uL (ref 0.0–0.1)
Basophils Relative: 0.9 % (ref 0.0–3.0)
Eosinophils Absolute: 0.3 10*3/uL (ref 0.0–0.7)
Eosinophils Relative: 4.3 % (ref 0.0–5.0)
HCT: 43.4 % (ref 39.0–52.0)
Hemoglobin: 14.6 g/dL (ref 13.0–17.0)
Lymphocytes Relative: 27.8 % (ref 12.0–46.0)
Lymphs Abs: 2.1 10*3/uL (ref 0.7–4.0)
MCHC: 33.6 g/dL (ref 30.0–36.0)
MCV: 89.6 fL (ref 78.0–100.0)
Monocytes Absolute: 0.8 10*3/uL (ref 0.1–1.0)
Monocytes Relative: 10.7 % (ref 3.0–12.0)
Neutro Abs: 4.3 10*3/uL (ref 1.4–7.7)
Neutrophils Relative %: 56.3 % (ref 43.0–77.0)
Platelets: 259 10*3/uL (ref 150.0–400.0)
RBC: 4.84 Mil/uL (ref 4.22–5.81)
RDW: 13.2 % (ref 11.5–15.5)
WBC: 7.7 10*3/uL (ref 4.0–10.5)

## 2023-03-31 LAB — COMPREHENSIVE METABOLIC PANEL
ALT: 24 U/L (ref 0–53)
AST: 14 U/L (ref 0–37)
Albumin: 4.5 g/dL (ref 3.5–5.2)
Alkaline Phosphatase: 55 U/L (ref 39–117)
BUN: 15 mg/dL (ref 6–23)
CO2: 30 meq/L (ref 19–32)
Calcium: 8.7 mg/dL (ref 8.4–10.5)
Chloride: 103 meq/L (ref 96–112)
Creatinine, Ser: 0.94 mg/dL (ref 0.40–1.50)
GFR: 85.28 mL/min (ref >60.00)
Glucose, Bld: 141 mg/dL — ABNORMAL HIGH (ref 70–99)
Potassium: 4.7 meq/L (ref 3.5–5.1)
Sodium: 137 meq/L (ref 135–145)
Total Bilirubin: 0.6 mg/dL (ref 0.2–1.2)
Total Protein: 7.3 g/dL (ref 6.0–8.3)

## 2023-03-31 LAB — MICROALBUMIN / CREATININE URINE RATIO
Creatinine,U: 165 mg/dL
Microalb Creat Ratio: 0.9 mg/g (ref 0.0–30.0)
Microalb, Ur: 1.5 mg/dL (ref 0.0–1.9)

## 2023-03-31 LAB — PSA, MEDICARE: PSA: 1.3 ng/mL (ref 0.10–4.00)

## 2023-03-31 LAB — HEMOGLOBIN A1C: Hgb A1c MFr Bld: 7.3 % — ABNORMAL HIGH (ref 4.6–6.5)

## 2023-03-31 LAB — LIPID PANEL
Cholesterol: 215 mg/dL — ABNORMAL HIGH (ref 0–200)
HDL: 36.7 mg/dL — ABNORMAL LOW (ref >39.00)
LDL Cholesterol: 152 mg/dL — ABNORMAL HIGH (ref 0–99)
NonHDL: 178.42
Total CHOL/HDL Ratio: 6
Triglycerides: 131 mg/dL (ref 0.0–149.0)
VLDL: 26.2 mg/dL (ref 0.0–40.0)

## 2023-03-31 LAB — TSH: TSH: 2.57 u[IU]/mL (ref 0.35–5.50)

## 2023-03-31 NOTE — Assessment & Plan Note (Signed)
Chronic Regular exercise and healthy diet encouraged Check lipid panel, CMP, TSH, CBC Continue lifestyle control - does not want to take medication  Can consider CT coronary calcium score to evaluate risk

## 2023-03-31 NOTE — Assessment & Plan Note (Signed)
Chronic  Lab Results  Component Value Date   HGBA1C 6.4 03/29/2022   Sugars well controlled Check A1c, urine microalbumin today Continue diet control Stressed regular exercise, diabetic diet

## 2023-03-31 NOTE — Assessment & Plan Note (Addendum)
Chronic Not using cpap - did not tolerate Did discuss inspire

## 2023-07-26 DIAGNOSIS — K625 Hemorrhage of anus and rectum: Secondary | ICD-10-CM | POA: Diagnosis not present

## 2023-08-28 DIAGNOSIS — K625 Hemorrhage of anus and rectum: Secondary | ICD-10-CM | POA: Diagnosis not present

## 2024-03-15 DIAGNOSIS — L814 Other melanin hyperpigmentation: Secondary | ICD-10-CM | POA: Diagnosis not present

## 2024-03-15 DIAGNOSIS — L719 Rosacea, unspecified: Secondary | ICD-10-CM | POA: Diagnosis not present

## 2024-03-15 DIAGNOSIS — L570 Actinic keratosis: Secondary | ICD-10-CM | POA: Diagnosis not present

## 2024-03-15 DIAGNOSIS — D1801 Hemangioma of skin and subcutaneous tissue: Secondary | ICD-10-CM | POA: Diagnosis not present

## 2024-03-15 DIAGNOSIS — L821 Other seborrheic keratosis: Secondary | ICD-10-CM | POA: Diagnosis not present

## 2024-03-18 ENCOUNTER — Ambulatory Visit (INDEPENDENT_AMBULATORY_CARE_PROVIDER_SITE_OTHER)

## 2024-03-18 VITALS — Ht 69.0 in | Wt 180.0 lb

## 2024-03-18 DIAGNOSIS — Z1211 Encounter for screening for malignant neoplasm of colon: Secondary | ICD-10-CM | POA: Diagnosis not present

## 2024-03-18 DIAGNOSIS — Z0000 Encounter for general adult medical examination without abnormal findings: Secondary | ICD-10-CM

## 2024-03-18 NOTE — Progress Notes (Addendum)
 Chief Complaint  Patient presents with   Medicare Wellness     Subjective:   Jack Wood is a 68 y.o. male who presents for a Medicare Annual Wellness Visit.  I connected with  Garnette LELON Houseman on 03/18/24 by a video and audio enabled telemedicine application and verified that I am speaking with the correct person using two identifiers.  Patient Location: Home  Provider Location: Office/Clinic  Persons Participating in Visit: Patient.  I discussed the limitations of evaluation and management by telemedicine. The patient expressed understanding and agreed to proceed.  Vital Signs: Because this visit was a virtual/telehealth visit, some criteria may be missing or patient reported. Any vitals not documented were not able to be obtained and vitals that have been documented are patient reported.   Allergies (verified) Patient has no known allergies.   History: Past Medical History:  Diagnosis Date   Acne rosacea    Diverticula, colon    diverticulitis in 08/2010, 11/2014   Past Surgical History:  Procedure Laterality Date   APPENDECTOMY  12/2009   Family History  Problem Relation Age of Onset   Osteoporosis Mother    Valvular heart disease Mother    Coronary artery disease Father    Valvular heart disease Father    Hypertension Father    Cirrhosis Sister 52       EtOH related   Social History   Occupational History   Not on file  Tobacco Use   Smoking status: Never   Smokeless tobacco: Never  Substance and Sexual Activity   Alcohol use: Not Currently    Alcohol/week: 2.0 standard drinks of alcohol    Types: 1 Glasses of wine, 1 Shots of liquor per week    Comment: social   Drug use: No   Sexual activity: Yes   Tobacco Counseling Counseling given: Not Answered  SDOH Screenings   Food Insecurity: No Food Insecurity (03/18/2024)  Housing: Unknown (03/18/2024)  Transportation Needs: No Transportation Needs (03/18/2024)  Utilities: Not At Risk  (03/18/2024)  Depression (PHQ2-9): Low Risk  (03/18/2024)  Physical Activity: Inactive (03/18/2024)  Social Connections: Moderately Isolated (03/18/2024)  Stress: No Stress Concern Present (03/18/2024)  Tobacco Use: Low Risk  (03/18/2024)  Health Literacy: Adequate Health Literacy (03/18/2024)   See flowsheets for full screening details  Depression Screen PHQ 2 & 9 Depression Scale- Over the past 2 weeks, how often have you been bothered by any of the following problems? Little interest or pleasure in doing things: 0 Feeling down, depressed, or hopeless (PHQ Adolescent also includes...irritable): 0 PHQ-2 Total Score: 0 Trouble falling or staying asleep, or sleeping too much: 0 Feeling tired or having little energy: 0 Poor appetite or overeating (PHQ Adolescent also includes...weight loss): 0 Feeling bad about yourself - or that you are a failure or have let yourself or your family down: 0 Trouble concentrating on things, such as reading the newspaper or watching television (PHQ Adolescent also includes...like school work): 0 Moving or speaking so slowly that other people could have noticed. Or the opposite - being so fidgety or restless that you have been moving around a lot more than usual: 0 Thoughts that you would be better off dead, or of hurting yourself in some way: 0 PHQ-9 Total Score: 0 If you checked off any problems, how difficult have these problems made it for you to do your work, take care of things at home, or get along with other people?: Not difficult at all  Depression Treatment  Depression Interventions/Treatment : PHQ2-9 Score <4 Follow-up Not Indicated     Goals Addressed               This Visit's Progress     Patient Stated (pt-stated)        Patient stated he plans to continue to be active       Visit info / Clinical Intake: Medicare Wellness Visit Type:: Initial Annual Wellness Visit Persons participating in visit:: patient Medicare Wellness Visit  Mode:: Video Because this visit was a virtual/telehealth visit:: vitals recorded from last visit If Telephone or Video please confirm:: I connected with the patient using audio enabled telemedicine application and verified that I am speaking with the correct person using two identifiers; I discussed the limitations of evaluation and management by telemedicine; The patient expressed understanding and agreed to proceed Patient Location:: Home Provider Location:: Office Information given by:: patient Interpreter Needed?: No Pre-visit prep was completed: yes AWV questionnaire completed by patient prior to visit?: no Living arrangements:: lives with spouse/significant other Patient's Overall Health Status Rating: good Typical amount of pain: none Does pain affect daily life?: no Are you currently prescribed opioids?: no  Dietary Habits and Nutritional Risks How many meals a day?: 3 Eats fruit and vegetables daily?: yes Most meals are obtained by: preparing own meals; eating out; having others provide food In the last 2 weeks, have you had any of the following?: none Diabetic:: (!) yes Any non-healing wounds?: no How often do you check your BS?: 0 Would you like to be referred to a Nutritionist or for Diabetic Management? : no  Functional Status Activities of Daily Living (to include ambulation/medication): Independent Ambulation: Independent with device- listed below Home Assistive Devices/Equipment: Eyeglasses Medication Administration: Independent Home Management: Independent Manage your own finances?: yes Primary transportation is: driving Concerns about vision?: no *vision screening is required for WTM* Concerns about hearing?: no  Fall Screening Falls in the past year?: 1 Number of falls in past year: 0 (1) Was there an injury with Fall?: 0 Fall Risk Category Calculator: 1 Patient Fall Risk Level: Low Fall Risk  Fall Risk Patient at Risk for Falls Due to: Other (Comment)  (tripped and bruised knee (no fracture)) Fall risk Follow up: Falls evaluation completed; Falls prevention discussed  Home and Transportation Safety: All rugs have non-skid backing?: yes All stairs or steps have railings?: yes (outside) Grab bars in the bathtub or shower?: yes Have non-skid surface in bathtub or shower?: yes Good home lighting?: yes Regular seat belt use?: yes Hospital stays in the last year:: no  Cognitive Assessment Difficulty concentrating, remembering, or making decisions? : no Will 6CIT or Mini Cog be Completed: yes What year is it?: 0 points What month is it?: 0 points Give patient an address phrase to remember (5 components): [address] [place], [place] About what time is it?: 0 points Count backwards from 20 to 1: 0 points Say the months of the year in reverse: 0 points Repeat the address phrase from earlier: 0 points 6 CIT Score: 0 points  Advance Directives (For Healthcare) Does Patient Have a Medical Advance Directive?: No Would patient like information on creating a medical advance directive?: No - Patient declined  Reviewed/Updated  Reviewed/Updated: Reviewed All (Medical, Surgical, Family, Medications, Allergies, Care Teams, Patient Goals)        Objective:    Today's Vitals   03/18/24 1538  Weight: 180 lb (81.6 kg)  Height: 5' 9 (1.753 m)   Body mass index  is 26.58 kg/m.  Current Medications (verified) Outpatient Encounter Medications as of 03/18/2024  Medication Sig   diclofenac  Sodium (VOLTAREN ) 1 % GEL Apply 4 g topically 4 (four) times daily.   No facility-administered encounter medications on file as of 03/18/2024.   Hearing/Vision screen Hearing Screening - Comments:: Denies hearing difficulties   Vision Screening - Comments:: Wears rx glasses - plans to schedule an appt w/Optometrist Immunizations and Health Maintenance Health Maintenance  Topic Date Due   FOOT EXAM  Never done   OPHTHALMOLOGY EXAM  Never done    Diabetic kidney evaluation - Urine ACR  Never done   Zoster Vaccines- Shingrix (1 of 2) Never done   HEMOGLOBIN A1C  09/29/2023   COVID-19 Vaccine (2 - 2025-26 season) 12/25/2023   Diabetic kidney evaluation - eGFR measurement  03/30/2024   Pneumococcal Vaccine: 50+ Years (1 of 2 - PCV) 03/30/2024 (Originally 02/03/1975)   Colonoscopy  03/18/2025 (Originally 08/26/2020)   Medicare Annual Wellness (AWV)  03/18/2025   DTaP/Tdap/Td (3 - Td or Tdap) 06/09/2031   Influenza Vaccine  Completed   Hepatitis C Screening  Completed   Meningococcal B Vaccine  Aged Out        Assessment/Plan:  This is a routine wellness examination for [place].  Patient Care Team: Geofm Glade PARAS, MD as PCP - General (Internal Medicine) Rollin Dover, MD (Gastroenterology) Darlean Ned, MD (Rehabilitation)  I have personally reviewed and noted the following in the patient's chart:   Medical and social history Use of alcohol, tobacco or illicit drugs  Current medications and supplements including opioid prescriptions. Functional ability and status Nutritional status Physical activity Advanced directives List of other physicians Hospitalizations, surgeries, and ER visits in previous 12 months Vitals Screenings to include cognitive, depression, and falls Referrals and appointments  Orders Placed This Encounter  Procedures   Cologuard   In addition, I have reviewed and discussed with patient certain preventive protocols, quality metrics, and best practice recommendations. A written personalized care plan for preventive services as well as general preventive health recommendations were provided to patient.   Verdie CHRISTELLA Saba, CMA   03/18/2024   Return in 1 year (on 03/18/2025).  After Visit Summary: (MyChart) Due to this being a telephonic visit, the after visit summary with patients personalized plan was offered to patient via MyChart   Nurse Notes: Ordered a Cologuard (last 2 Colonoscopy - Negative).   Scheduled 2026 AWV/CPE appts.

## 2024-03-18 NOTE — Patient Instructions (Addendum)
 Mr. Jack Wood,  Thank you for taking the time for your Medicare Wellness Visit. I appreciate your continued commitment to your health goals. Please review the care plan we discussed, and feel free to reach out if I can assist you further.  Please note that Annual Wellness Visits do not include a physical exam. Some assessments may be limited, especially if the visit was conducted virtually. If needed, we may recommend an in-person follow-up with your provider.  Ongoing Care Seeing your primary care provider every 3 to 6 months helps us  monitor your health and provide consistent, personalized care.   Referrals If a referral was made during today's visit and you haven't received any updates within two weeks, please contact the referred provider directly to check on the status.  Recommended Screenings:  Health Maintenance  Topic Date Due   Complete foot exam   Never done   Eye exam for diabetics  Never done   Yearly kidney health urinalysis for diabetes  Never done   Zoster (Shingles) Vaccine (1 of 2) Never done   Hemoglobin A1C  09/29/2023   COVID-19 Vaccine (2 - 2025-26 season) 12/25/2023   Yearly kidney function blood test for diabetes  03/30/2024   Pneumococcal Vaccine for age over 47 (1 of 2 - PCV) 03/30/2024*   Colon Cancer Screening  03/18/2025*   Medicare Annual Wellness Visit  03/18/2025   DTaP/Tdap/Td vaccine (3 - Td or Tdap) 06/09/2031   Flu Shot  Completed   Hepatitis C Screening  Completed   Meningitis B Vaccine  Aged Out  *Topic was postponed. The date shown is not the original due date.       03/18/2024    3:39 PM  Advanced Directives  Does Patient Have a Medical Advance Directive? No  Would patient like information on creating a medical advance directive? No - Patient declined    Vision: Annual vision screenings are recommended for early detection of glaucoma, cataracts, and diabetic retinopathy. These exams can also reveal signs of chronic conditions such as  diabetes and high blood pressure.  Dental: Annual dental screenings help detect early signs of oral cancer, gum disease, and other conditions linked to overall health, including heart disease and diabetes.

## 2024-03-29 ENCOUNTER — Ambulatory Visit

## 2024-03-31 ENCOUNTER — Encounter: Payer: Self-pay | Admitting: Internal Medicine

## 2024-03-31 NOTE — Patient Instructions (Addendum)
 Blood work was ordered.       Medications changes include :   None    A referral was ordered and someone will call you to schedule an appointment.     Return in about 6 months (around 09/30/2024) for follow up.    Health Maintenance, Male Adopting a healthy lifestyle and getting preventive care are important in promoting health and wellness. Ask your health care provider about: The right schedule for you to have regular tests and exams. Things you can do on your own to prevent diseases and keep yourself healthy. What should I know about diet, weight, and exercise? Eat a healthy diet  Eat a diet that includes plenty of vegetables, fruits, low-fat dairy products, and lean protein. Do not eat a lot of foods that are high in solid fats, added sugars, or sodium. Maintain a healthy weight Body mass index (BMI) is a measurement that can be used to identify possible weight problems. It estimates body fat based on height and weight. Your health care provider can help determine your BMI and help you achieve or maintain a healthy weight. Get regular exercise Get regular exercise. This is one of the most important things you can do for your health. Most adults should: Exercise for at least 150 minutes each week. The exercise should increase your heart rate and make you sweat (moderate-intensity exercise). Do strengthening exercises at least twice a week. This is in addition to the moderate-intensity exercise. Spend less time sitting. Even light physical activity can be beneficial. Watch cholesterol and blood lipids Have your blood tested for lipids and cholesterol at 66 years of age, then have this test every 5 years. You may need to have your cholesterol levels checked more often if: Your lipid or cholesterol levels are high. You are older than 66 years of age. You are at high risk for heart disease. What should I know about cancer screening? Many types of cancers can be detected  early and may often be prevented. Depending on your health history and family history, you may need to have cancer screening at various ages. This may include screening for: Colorectal cancer. Prostate cancer. Skin cancer. Lung cancer. What should I know about heart disease, diabetes, and high blood pressure? Blood pressure and heart disease High blood pressure causes heart disease and increases the risk of stroke. This is more likely to develop in people who have high blood pressure readings or are overweight. Talk with your health care provider about your target blood pressure readings. Have your blood pressure checked: Every 3-5 years if you are 44-49 years of age. Every year if you are 29 years old or older. If you are between the ages of 13 and 40 and are a current or former smoker, ask your health care provider if you should have a one-time screening for abdominal aortic aneurysm (AAA). Diabetes Have regular diabetes screenings. This checks your fasting blood sugar level. Have the screening done: Once every three years after age 32 if you are at a normal weight and have a low risk for diabetes. More often and at a younger age if you are overweight or have a high risk for diabetes. What should I know about preventing infection? Hepatitis B If you have a higher risk for hepatitis B, you should be screened for this virus. Talk with your health care provider to find out if you are at risk for hepatitis B infection. Hepatitis C Blood testing is recommended  for: Everyone born from [birth year] through 1963. Anyone with known risk factors for hepatitis C. Sexually transmitted infections (STIs) You should be screened each year for STIs, including gonorrhea and chlamydia, if: You are sexually active and are younger than 66 years of age. You are older than 66 years of age and your health care provider tells you that you are at risk for this type of infection. Your sexual activity has changed since  you were last screened, and you are at increased risk for chlamydia or gonorrhea. Ask your health care provider if you are at risk. Ask your health care provider about whether you are at high risk for HIV. Your health care provider may recommend a prescription medicine to help prevent HIV infection. If you choose to take medicine to prevent HIV, you should first get tested for HIV. You should then be tested every 3 months for as long as you are taking the medicine. Follow these instructions at home: Alcohol use Do not drink alcohol if your health care provider tells you not to drink. If you drink alcohol: Limit how much you have to 0-2 drinks a day. Know how much alcohol is in your drink. In the [place] one drink equals one 12 oz bottle of beer (355 mL), one 5 oz glass of wine (148 mL), or one 1 oz glass of hard liquor (44 mL). Lifestyle Do not use any products that contain nicotine or tobacco. These products include cigarettes, chewing tobacco, and vaping devices, such as e-cigarettes. If you need help quitting, ask your health care provider. Do not use street drugs. Do not share needles. Ask your health care provider for help if you need support or information about quitting drugs. General instructions Schedule regular health, dental, and eye exams. Stay current with your vaccines. Tell your health care provider if: You often feel depressed. You have ever been abused or do not feel safe at home. Summary Adopting a healthy lifestyle and getting preventive care are important in promoting health and wellness. Follow your health care provider's instructions about healthy diet, exercising, and getting tested or screened for diseases. Follow your health care provider's instructions on monitoring your cholesterol and blood pressure. This information is not intended to replace advice given to you by your health care provider. Make sure you discuss any questions you have with your health care  provider. Document Revised: 08/31/2020 Document Reviewed: 08/31/2020 Elsevier Patient Education  2024 [employer].

## 2024-03-31 NOTE — Progress Notes (Unsigned)
 Subjective:    Patient ID: Jack Wood, male    DOB: [birth year], 68 y.o.   MRN: [id number]     HPI Jack Wood is here for a physical exam and his chronic medical problems.   Doing well.  No concerns.   Medications and allergies reviewed with patient and updated if appropriate.  Current Outpatient Medications on File Prior to Visit  Medication Sig Dispense Refill   diclofenac  Sodium (VOLTAREN ) 1 % GEL Apply 4 g topically 4 (four) times daily. 150 g 5   No current facility-administered medications on file prior to visit.    Review of Systems  Constitutional:  Negative for fever.  Eyes:  Negative for visual disturbance.  Respiratory:  Negative for cough, shortness of breath and wheezing.   Cardiovascular:  Negative for chest pain, palpitations and leg swelling.  Gastrointestinal:  Negative for abdominal pain, blood in stool, constipation and diarrhea.       Loose stools - chronic, No gerd  Genitourinary:  Negative for difficulty urinating, dysuria and hematuria.  Musculoskeletal:  Positive for arthralgias (wrist  -right). Negative for back pain.  Skin:  Negative for rash.  Neurological:  Negative for dizziness, light-headedness, numbness and headaches.  Psychiatric/Behavioral:  Negative for dysphoric mood and sleep disturbance. The patient is not nervous/anxious.        Objective:   Vitals:   04/01/24 0752  BP: 118/80  Pulse: 66  Temp: 98.2 F (36.8 C)  SpO2: 96%   Filed Weights   04/01/24 0752  Weight: 183 lb (83 kg)   Body mass index is 27.02 kg/m.  BP Readings from Last 3 Encounters:  04/01/24 118/80  03/31/23 122/80  03/29/22 114/72    Wt Readings from Last 3 Encounters:  04/01/24 183 lb (83 kg)  03/18/24 180 lb (81.6 kg)  03/31/23 187 lb (84.8 kg)      Physical Exam Constitutional: He appears well-developed and well-nourished. No distress.  HENT:  Head: Normocephalic and atraumatic.  Right Ear: External ear normal.  Left Ear: External  ear normal.  Normal ear canals and TM b/l  Mouth/Throat: Oropharynx is clear and moist. Eyes: Conjunctivae and EOM are normal.  Neck: Neck supple. No tracheal deviation present. No thyromegaly present.  No carotid bruit  Cardiovascular: Normal rate, regular rhythm, normal heart sounds and intact distal pulses.   No murmur heard.  No lower extremity edema. Pulmonary/Chest: Effort normal and breath sounds normal. No respiratory distress. He has no wheezes. He has no rales.  Abdominal: Soft. He exhibits no distension. There is no tenderness.  Genitourinary: deferred  Lymphadenopathy:   He has no cervical adenopathy.  Skin: Skin is warm and dry. He is not diaphoretic.  Psychiatric: He has a normal mood and affect. His behavior is normal.         Assessment & Plan:   Physical exam: Screening blood work  ordered Exercise   fly fishing Weight  is good Substance abuse   none Sees Derm annually  Reviewed recommended immunizations.   Health Maintenance  Topic Date Due   FOOT EXAM  Never done   OPHTHALMOLOGY EXAM  Never done   Diabetic kidney evaluation - Urine ACR  Never done   Pneumococcal Vaccine: 50+ Years (1 of 2 - PCV) Never done   Zoster Vaccines- Shingrix (1 of 2) Never done   HEMOGLOBIN A1C  09/29/2023   Diabetic kidney evaluation - eGFR measurement  03/30/2024   COVID-19 Vaccine (2 - 2025-26 season) 04/16/2024 (Originally  12/25/2023)   Colonoscopy  03/18/2025 (Originally 08/26/2020)   Medicare Annual Wellness (AWV)  03/18/2025   DTaP/Tdap/Td (3 - Td or Tdap) 06/09/2031   Influenza Vaccine  Completed   Hepatitis C Screening  Completed   Meningococcal B Vaccine  Aged Out     See Problem List for Assessment and Plan of chronic medical problems.

## 2024-04-01 ENCOUNTER — Encounter: Payer: Self-pay | Admitting: Internal Medicine

## 2024-04-01 ENCOUNTER — Ambulatory Visit: Payer: Medicare PPO | Admitting: Internal Medicine

## 2024-04-01 ENCOUNTER — Ambulatory Visit: Payer: Self-pay | Admitting: Internal Medicine

## 2024-04-01 VITALS — BP 118/80 | HR 66 | Temp 98.2°F | Ht 69.0 in | Wt 183.0 lb

## 2024-04-01 DIAGNOSIS — G4733 Obstructive sleep apnea (adult) (pediatric): Secondary | ICD-10-CM

## 2024-04-01 DIAGNOSIS — Z0000 Encounter for general adult medical examination without abnormal findings: Secondary | ICD-10-CM

## 2024-04-01 DIAGNOSIS — E1169 Type 2 diabetes mellitus with other specified complication: Secondary | ICD-10-CM

## 2024-04-01 DIAGNOSIS — Z125 Encounter for screening for malignant neoplasm of prostate: Secondary | ICD-10-CM

## 2024-04-01 DIAGNOSIS — H348192 Central retinal vein occlusion, unspecified eye, stable: Secondary | ICD-10-CM

## 2024-04-01 LAB — COMPREHENSIVE METABOLIC PANEL WITH GFR
ALT: 31 U/L (ref 0–53)
AST: 17 U/L (ref 0–37)
Albumin: 4.6 g/dL (ref 3.5–5.2)
Alkaline Phosphatase: 62 U/L (ref 39–117)
BUN: 16 mg/dL (ref 6–23)
CO2: 27 meq/L (ref 19–32)
Calcium: 9 mg/dL (ref 8.4–10.5)
Chloride: 100 meq/L (ref 96–112)
Creatinine, Ser: 0.94 mg/dL (ref 0.40–1.50)
GFR: 84.68 mL/min (ref >60.00)
Glucose, Bld: 175 mg/dL — ABNORMAL HIGH (ref 70–99)
Potassium: 4.7 meq/L (ref 3.5–5.1)
Sodium: 138 meq/L (ref 135–145)
Total Bilirubin: 0.6 mg/dL (ref 0.2–1.2)
Total Protein: 7.5 g/dL (ref 6.0–8.3)

## 2024-04-01 LAB — CBC
HCT: 43.9 % (ref 39.0–52.0)
Hemoglobin: 15.3 g/dL (ref 13.0–17.0)
MCHC: 35 g/dL (ref 30.0–36.0)
MCV: 87.7 fl (ref 78.0–100.0)
Platelets: 258 K/uL (ref 150.0–400.0)
RBC: 5 Mil/uL (ref 4.22–5.81)
RDW: 13.2 % (ref 11.5–15.5)
WBC: 7 K/uL (ref 4.0–10.5)

## 2024-04-01 LAB — LIPID PANEL
Cholesterol: 234 mg/dL — ABNORMAL HIGH (ref 0–200)
HDL: 38.2 mg/dL — ABNORMAL LOW (ref >39.00)
LDL Cholesterol: 167 mg/dL — ABNORMAL HIGH (ref 0–99)
NonHDL: 195.93
Total CHOL/HDL Ratio: 6
Triglycerides: 143 mg/dL (ref 0.0–149.0)
VLDL: 28.6 mg/dL (ref 0.0–40.0)

## 2024-04-01 LAB — MICROALBUMIN / CREATININE URINE RATIO
Creatinine,U: 170.2 mg/dL
Microalb Creat Ratio: 11 mg/g (ref 0.0–30.0)
Microalb, Ur: 1.9 mg/dL (ref 0.0–1.9)

## 2024-04-01 LAB — PSA, MEDICARE: PSA: 1.12 ng/mL (ref 0.10–4.00)

## 2024-04-01 LAB — TSH: TSH: 2.55 u[IU]/mL (ref 0.35–5.50)

## 2024-04-01 LAB — HEMOGLOBIN A1C: Hgb A1c MFr Bld: 7.5 % — ABNORMAL HIGH (ref 4.6–6.5)

## 2024-04-01 MED ORDER — EMPAGLIFLOZIN 10 MG PO TABS: 10.0000 mg | ORAL_TABLET | Freq: Every day | ORAL | 3 refills | Status: AC

## 2024-04-01 NOTE — Assessment & Plan Note (Addendum)
 Chronic Associated with diabetes Regular exercise and healthy diet encouraged Check lipid panel, CMP, TSH, CBC Continue lifestyle control - does not want to take medication.  Reviewed recommendation given that he is a diabetic he should be on a statin

## 2024-04-01 NOTE — Assessment & Plan Note (Signed)
 Chronic Associated with hyperlipidemia Lab Results  Component Value Date   HGBA1C 7.3 (H) 03/31/2023   Sugars not ideally controlled when last checked Check A1c, urine microalbumin today Continue diet controlled Stressed regular exercise, diabetic diet If sugars are not better controlled we will advise starting medication

## 2024-04-01 NOTE — Assessment & Plan Note (Signed)
 History of retinal vein occlusion Up-to-date with eye exams

## 2024-04-01 NOTE — Assessment & Plan Note (Addendum)
 Chronic Not using cpap - did not tolerate Sleeping well and does not believe he needs treatment

## 2024-04-14 LAB — COLOGUARD: COLOGUARD: NEGATIVE

## 2024-04-16 ENCOUNTER — Ambulatory Visit: Payer: Self-pay | Admitting: Internal Medicine

## 2024-09-30 ENCOUNTER — Ambulatory Visit: Admitting: Internal Medicine

## 2025-04-02 ENCOUNTER — Encounter: Admitting: Internal Medicine

## 2025-04-02 ENCOUNTER — Ambulatory Visit
